# Patient Record
Sex: Male | Born: 1970 | Race: White | Hispanic: No | Smoking: Never smoker
Health system: Southern US, Community
[De-identification: ages and names within clinical notes are randomized; demographics above are authoritative.]

## PROBLEM LIST (undated history)

## (undated) DIAGNOSIS — M549 Dorsalgia, unspecified: Secondary | ICD-10-CM

## (undated) DIAGNOSIS — I10 Essential (primary) hypertension: Secondary | ICD-10-CM

## (undated) DIAGNOSIS — G4733 Obstructive sleep apnea (adult) (pediatric): Secondary | ICD-10-CM

## (undated) DIAGNOSIS — M542 Cervicalgia: Secondary | ICD-10-CM

## (undated) DIAGNOSIS — T7840XA Allergy, unspecified, initial encounter: Secondary | ICD-10-CM

## (undated) DIAGNOSIS — E785 Hyperlipidemia, unspecified: Secondary | ICD-10-CM

## (undated) DIAGNOSIS — H539 Unspecified visual disturbance: Secondary | ICD-10-CM

## (undated) DIAGNOSIS — R51 Headache: Secondary | ICD-10-CM

## (undated) DIAGNOSIS — G4700 Insomnia, unspecified: Secondary | ICD-10-CM

## (undated) DIAGNOSIS — G8929 Other chronic pain: Secondary | ICD-10-CM

## (undated) DIAGNOSIS — R519 Headache, unspecified: Secondary | ICD-10-CM

## (undated) HISTORY — DX: Cervicalgia: M54.2

## (undated) HISTORY — DX: Obstructive sleep apnea (adult) (pediatric): G47.33

## (undated) HISTORY — PX: UPPER GI ENDOSCOPY: SHX6162

## (undated) HISTORY — DX: Headache, unspecified: R51.9

## (undated) HISTORY — DX: Dorsalgia, unspecified: M54.9

## (undated) HISTORY — PX: ELBOW SURGERY: SHX618

## (undated) HISTORY — DX: Headache: R51

## (undated) HISTORY — DX: Unspecified visual disturbance: H53.9

## (undated) HISTORY — DX: Other chronic pain: G89.29

## (undated) HISTORY — DX: Hyperlipidemia, unspecified: E78.5

## (undated) HISTORY — DX: Insomnia, unspecified: G47.00

## (undated) HISTORY — DX: Allergy, unspecified, initial encounter: T78.40XA

## (undated) HISTORY — DX: Essential (primary) hypertension: I10

---

## 1994-10-23 HISTORY — PX: VASECTOMY: SHX75

## 1999-07-21 ENCOUNTER — Other Ambulatory Visit: Admission: RE | Admit: 1999-07-21 | Discharge: 1999-07-21 | Payer: Self-pay | Admitting: Urology

## 1999-10-24 HISTORY — PX: LASIK: SHX215

## 2009-04-02 ENCOUNTER — Encounter: Admission: RE | Admit: 2009-04-02 | Discharge: 2009-04-02 | Payer: Self-pay | Admitting: Orthopaedic Surgery

## 2009-04-02 IMAGING — RF DG FACET JT INJ C&T SPINE SINGLE LEVEL *R*
5 series · 5 of 5 positions shown · non-contrast
Comparison: none

BILATERAL C2-3 CERVICAL FACET INJECTIONS.
CLINICAL DATA: Neck pain

Procedure:  Right and left C2-3 facet injection

[Series 1: myelogram  white · 1 of 1 slices shown (1 of 5)]
[im 1/1]
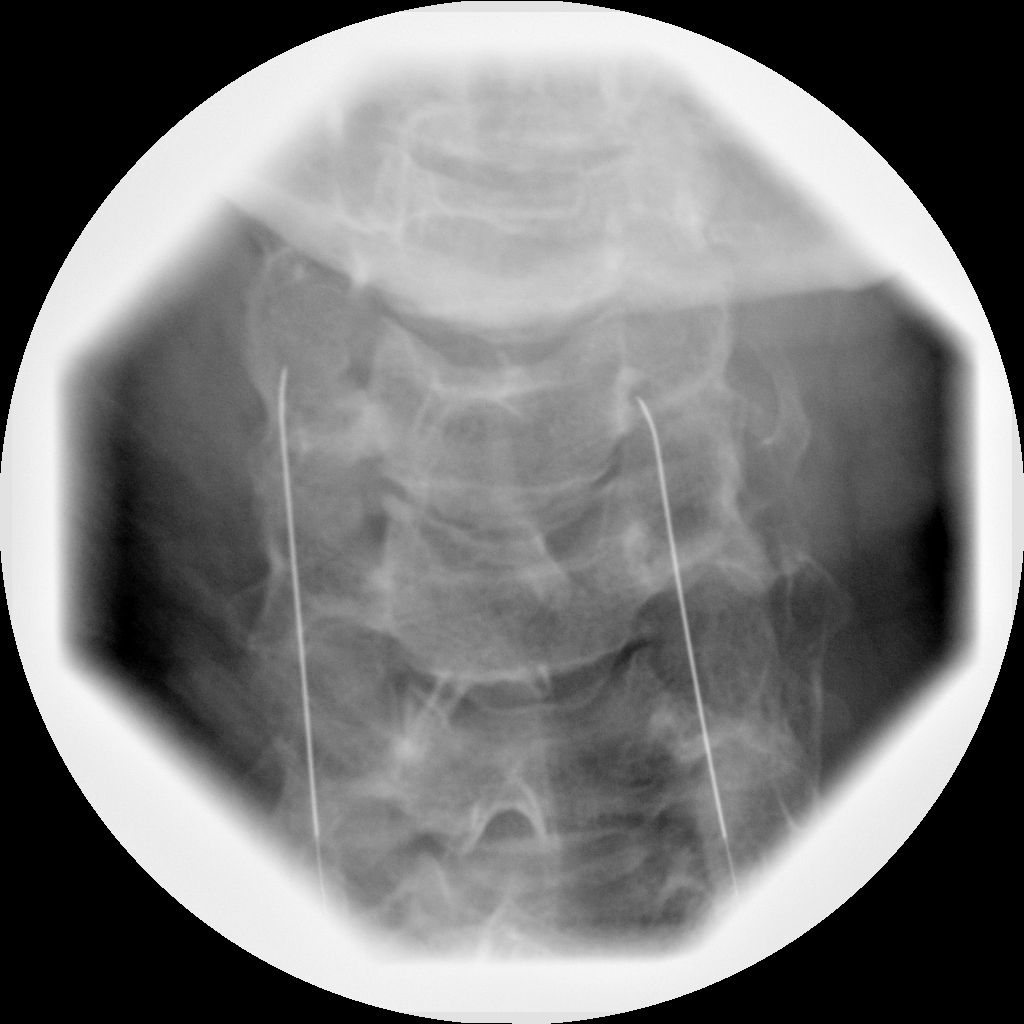

[Series 2: myelogram  white · 1 of 1 slices shown (2 of 5)]
[im 1/1]
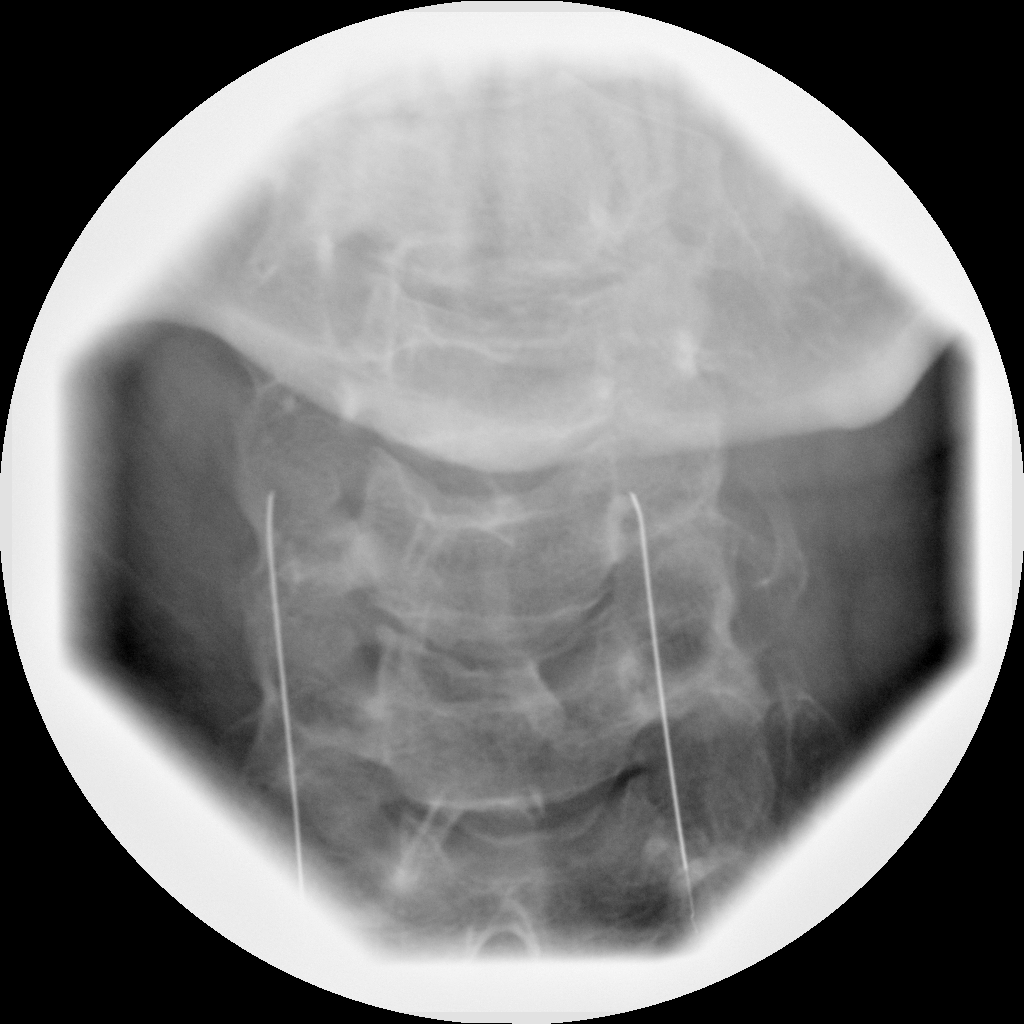

[Series 3: myelogram  white · 1 of 1 slices shown (3 of 5)]
[im 1/1]
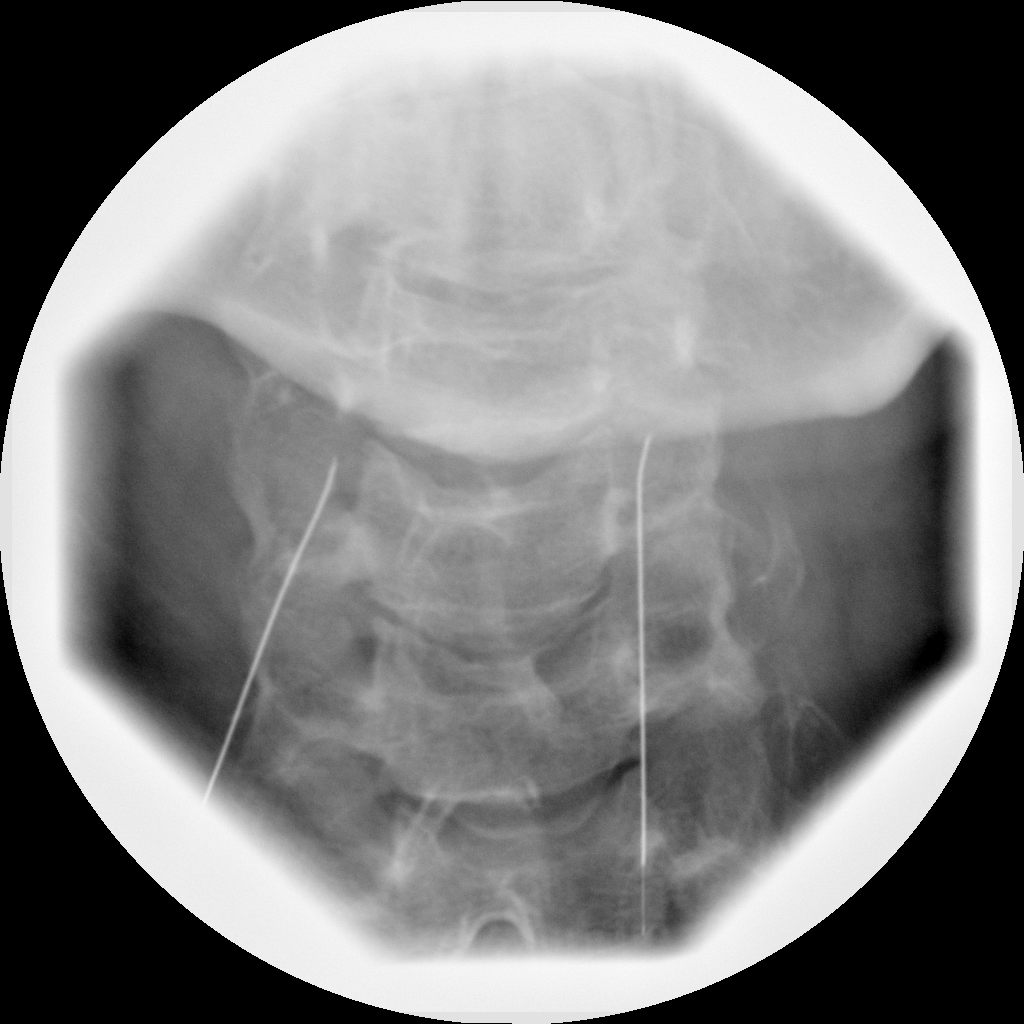

[Series 4: myelogram  white · 1 of 1 slices shown (4 of 5)]
[im 1/1]
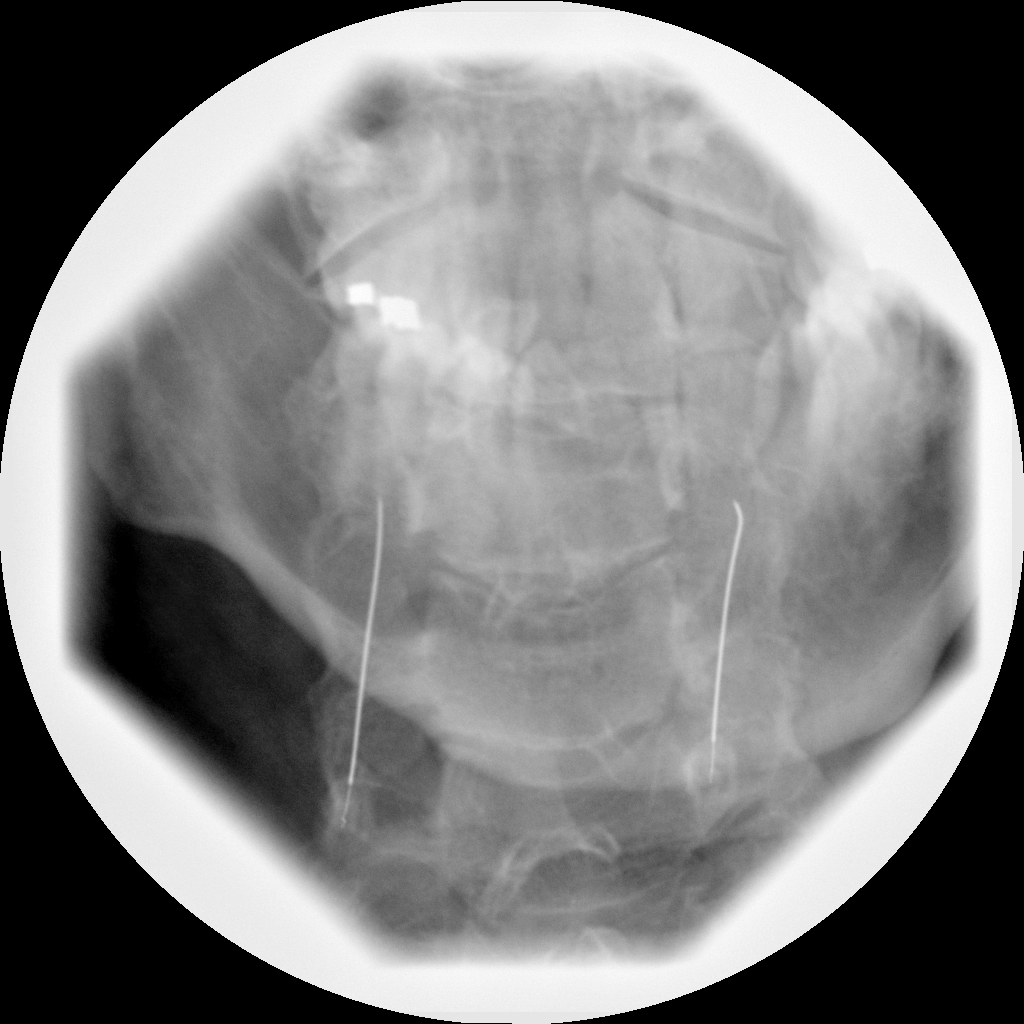

[Series 5: myelogram  white · 1 of 1 slices shown (5 of 5)]
[im 1/1]
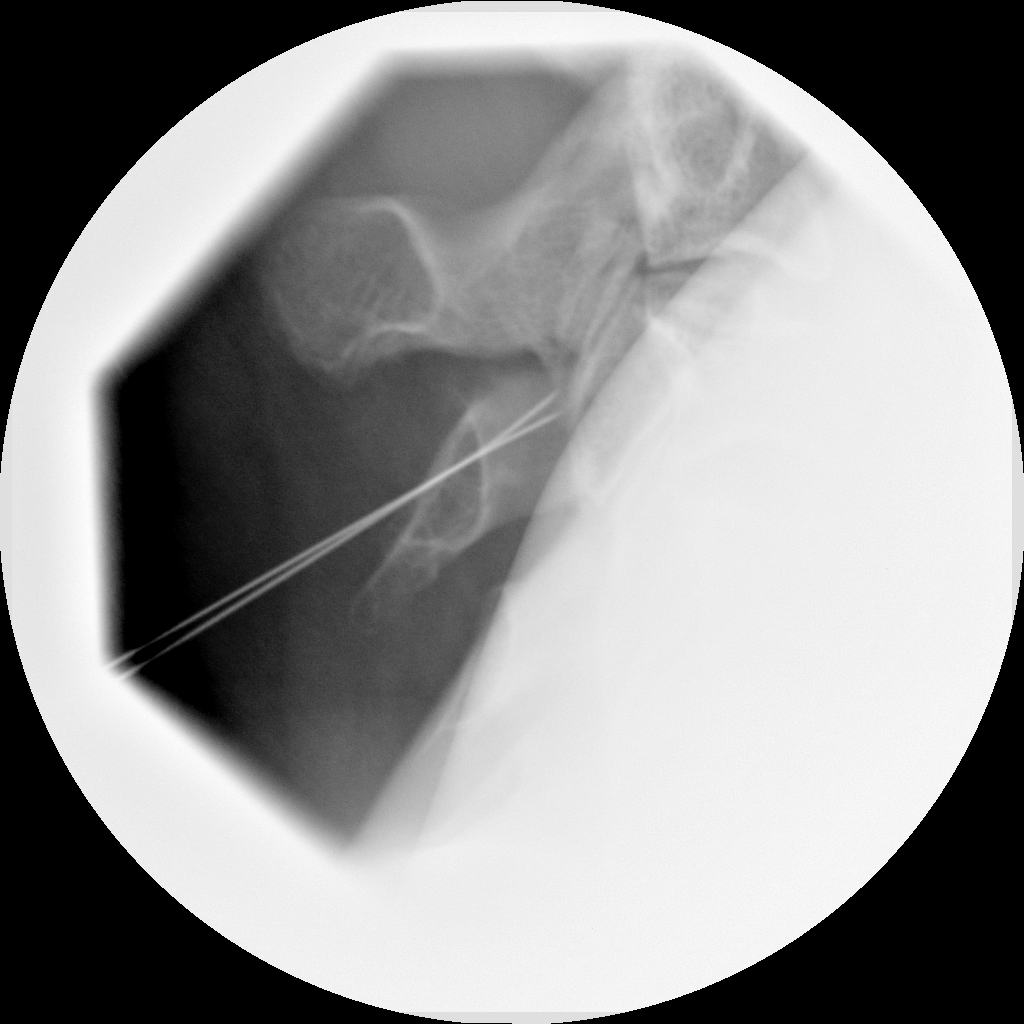

[5 of 5 positions shown; findings below may reference images not displayed]

FINDINGS: Informed, written consent was obtained prior to the first
procedure.  The   right C2-3 facet was localized fluoroscopically,
and the skin was marked.  A thorough Betadine  scrub of the skin
was performed, and a sterile drape was applied.  The skin and
subcutaneous soft tissues were anesthetized with 1%  lidocaine.

Subsequently, a  25 gauge 3.5 inch  spinal needle was advanced to
the facet joint.  Fluoroscopy confirmed the needle tip to be intra-
articular.  Subsequently, 2 mg of Decadron was injected into the
facet joint, and flushed with trace amount of 1% lidocaine.  Post
procedure, the patient was improved and able to ambulate without
pain.

An identical procedure was formed on the left at C2-3.

Fluoroscopy time: 74 seconds
IMPRESSION: Technically successful intra-articular steroid placement, bilateral
C2-3 facet.

The patient is counseled to see if  this procedure alleviates a
significant amount of his headache.  Based on review of the MR, I
do not see any significant amount of inflammation at C1-2.  I felt
therefore that  bilateral C2-3 facet injections should be performed
initially to see if this level was his predominant pain generator.

## 2013-05-26 ENCOUNTER — Encounter: Payer: Self-pay | Admitting: Pulmonary Disease

## 2013-05-26 ENCOUNTER — Ambulatory Visit (INDEPENDENT_AMBULATORY_CARE_PROVIDER_SITE_OTHER): Payer: BC Managed Care – PPO | Admitting: Pulmonary Disease

## 2013-05-26 VITALS — BP 130/76 | HR 63 | Temp 98.9°F | Ht 72.0 in | Wt 206.0 lb

## 2013-05-26 DIAGNOSIS — R0683 Snoring: Secondary | ICD-10-CM

## 2013-05-26 DIAGNOSIS — R0989 Other specified symptoms and signs involving the circulatory and respiratory systems: Secondary | ICD-10-CM

## 2013-05-26 DIAGNOSIS — G894 Chronic pain syndrome: Secondary | ICD-10-CM

## 2013-05-26 DIAGNOSIS — G4700 Insomnia, unspecified: Secondary | ICD-10-CM

## 2013-05-26 DIAGNOSIS — G2581 Restless legs syndrome: Secondary | ICD-10-CM

## 2013-05-26 DIAGNOSIS — R0609 Other forms of dyspnea: Secondary | ICD-10-CM

## 2013-05-26 NOTE — Progress Notes (Deleted)
  Subjective:    Patient ID: Jacob Ryan, male    DOB: December 09, 1970, 42 y.o.   MRN: [id number]  HPI    Review of Systems  Constitutional: Negative for fever, chills, diaphoresis, activity change, appetite change, fatigue and unexpected weight change.  HENT: Negative for hearing loss, ear pain, nosebleeds, congestion, sore throat, facial swelling, rhinorrhea, sneezing, mouth sores, trouble swallowing, neck pain, neck stiffness, dental problem, voice change, postnasal drip, sinus pressure, tinnitus and ear discharge.   Eyes: Negative for photophobia, discharge, itching and visual disturbance.  Respiratory: Negative for apnea, cough, choking, chest tightness, shortness of breath, wheezing and stridor.   Cardiovascular: Negative for chest pain, palpitations and leg swelling.  Gastrointestinal: Negative for nausea, vomiting, abdominal pain, constipation, blood in stool and abdominal distention.  Genitourinary: Negative for dysuria, urgency, frequency, hematuria, flank pain, decreased urine volume and difficulty urinating.  Musculoskeletal: Negative for myalgias, back pain, joint swelling, arthralgias and gait problem.  Skin: Negative for color change, pallor and rash.  Neurological: Positive for headaches. Negative for dizziness, tremors, seizures, syncope, speech difficulty, weakness, light-headedness and numbness.  Hematological: Negative for adenopathy. Does not bruise/bleed easily.  Psychiatric/Behavioral: Negative for confusion, sleep disturbance and agitation. The patient is not nervous/anxious.        Objective:   Physical Exam        Assessment & Plan:

## 2013-05-26 NOTE — Patient Instructions (Signed)
Will get copy of sleep study and call with results Follow up in 6 weeks

## 2013-05-26 NOTE — Progress Notes (Signed)
Chief Complaint  Patient presents with  . Sleep Consult    Self referral. Has a hard time getting to sleep. Epworth: 2.    History of Present Illness: Jacob Ryan is a 42 y.o. male for evaluation of sleep problems.  He made this appointment on his own.    He has noticed trouble with his sleep for at least 20 years.  He started [place] years ago.  This helped initially, but is not working as well now.  He has a crazy schedule, and will lay in bed thinking about things.  He tried taking Z-quil this past week >> this helped for a few nights, but then he was back to having trouble again.  He has been told he snores.  He is not aware of whether he stops breathing while asleep.  He has trouble falling asleep mostly.  He says he can stay awake for 36 to 48 hours at a stretch before sleeping.  When he sleeps he can sometimes sleep for a few hours, and sometimes up to 6 hours at a stretch >> this does not happen often.  He goes to sleep at 1030 pm.  He falls asleep after several hours if at all.  He wakes up at 330 am when his girlfriend's alarm goes off, and will not be able to fall back to sleep.  He gets out of bed at 6 am.  He feels tired in the morning.  He gets headaches all day, and this is related to neck problems.  He has been followed by Dr. Chriss Czar with neurology.  He does not use anything to help him fall sleep or stay awake.  He reports having a sleep study at [facility] about 6 months ago >> he ws told he did not have any sleep apnea, but he did have mild restless legs.  He was tried on therapy for restless legs, but this didn't help at all.  He denies sleep walking, sleep talking, bruxism, or nightmares.  He denies sleep hallucinations, sleep paralysis, or cataplexy.  The Epworth score is 2 out of 24.  Tests:   Jacob Ryan  has a past medical history of Chronic headaches.  Jacob Ryan  has no past surgical history on file.  Prior to  Admission medications   Medication Sig Start Date End Date Taking? Authorizing Provider  carbamazepine (TEGRETOL) 200 MG tablet Take 1 tablet by mouth 2 (two) times daily. 05/09/13  Yes Historical Provider, MD  gabapentin (NEURONTIN) 300 MG capsule Take 1 capsule by mouth daily. 05/09/13  Yes Historical Provider, MD  meloxicam (MOBIC) 15 MG tablet Take 1 tablet by mouth daily. 05/09/13  Yes Historical Provider, MD  zolpidem (AMBIEN) 10 MG tablet Take 1 tablet by mouth at bedtime as needed. 05/09/13  Yes Historical Provider, MD    Allergies  Allergen Reactions  . Amoxicillin     His family history is not on file.  He  reports that he has never smoked. He has never used smokeless tobacco. He reports that he does not drink alcohol or use illicit drugs.  Review of Systems  Constitutional: Negative for fever, chills, diaphoresis, activity change, appetite change, fatigue and unexpected weight change.  HENT: Negative for hearing loss, ear pain, nosebleeds, congestion, sore throat, facial swelling, rhinorrhea, sneezing, mouth sores, trouble swallowing, neck pain, neck stiffness, dental problem, voice change, postnasal drip, sinus pressure, tinnitus and ear discharge.   Eyes: Negative for photophobia, discharge, itching  and visual disturbance.  Respiratory: Negative for apnea, cough, choking, chest tightness, shortness of breath, wheezing and stridor.   Cardiovascular: Negative for chest pain, palpitations and leg swelling.  Gastrointestinal: Negative for nausea, vomiting, abdominal pain, constipation, blood in stool and abdominal distention.  Genitourinary: Negative for dysuria, urgency, frequency, hematuria, flank pain, decreased urine volume and difficulty urinating.  Musculoskeletal: Negative for myalgias, back pain, joint swelling, arthralgias and gait problem.  Skin: Negative for color change, pallor and rash.  Neurological: Positive for headaches. Negative for dizziness, tremors, seizures,  syncope, speech difficulty, weakness, light-headedness and numbness.  Hematological: Negative for adenopathy. Does not bruise/bleed easily.  Psychiatric/Behavioral: Negative for confusion, sleep disturbance and agitation. The patient is not nervous/anxious.    Physical Exam:  General - No distress ENT - No sinus tenderness, no oral exudate, MP 3, narrow jaw, enlarged tongue, no LAN, no thyromegaly, TM clear, pupils equal/reactive Cardiac - s1s2 regular, no murmur, pulses symmetric Chest - No wheeze/rales/dullness, good air entry, normal respiratory excursion Back - No focal tenderness Abd - Soft, non-tender, no organomegaly, + bowel sounds Ext - No edema Neuro - Normal strength, cranial nerves intact Skin - No rashes Psych - Normal mood, and behavior  Assessment:  Coralyn Helling, MD [facility] Pulmonary/Critical Care 05/29/2013, 11:01 AM Pager:  [phone] After 3pm call: [phone]

## 2013-05-29 DIAGNOSIS — G2581 Restless legs syndrome: Secondary | ICD-10-CM | POA: Insufficient documentation

## 2013-05-29 DIAGNOSIS — G478 Other sleep disorders: Secondary | ICD-10-CM | POA: Insufficient documentation

## 2013-05-29 DIAGNOSIS — G894 Chronic pain syndrome: Secondary | ICD-10-CM | POA: Insufficient documentation

## 2013-05-29 DIAGNOSIS — G4700 Insomnia, unspecified: Secondary | ICD-10-CM | POA: Insufficient documentation

## 2013-05-29 NOTE — Assessment & Plan Note (Signed)
I have explained to him that his sleep issues have been present for years and are not likely to improve quickly.  I would like to get a copy of his recent sleep study first before proceeding with additional interventions with his insomnia.  He can continue [place] for now.  If his recent sleep study is unrevealing with adequate sleep time in lab, would then need to discuss cognitive behavioral techniques more.  For this he may need assist from psychiatry/psychology.

## 2013-05-29 NOTE — Assessment & Plan Note (Signed)
He was told he has restless legs after recent sleep study.  He does not have significant leg symptoms at present.  Perhaps he was found to have increased periodic limb movements syndrome w/o the disorder.  Will assess further after review of his recent sleep study.

## 2013-05-29 NOTE — Assessment & Plan Note (Signed)
He has chronic neck and back pain.  These issues are likely contributing to his sleep problems.  He is followed by neurology.     

## 2013-05-29 NOTE — Assessment & Plan Note (Signed)
He reports snoring, and upper airway anatomy is suggestive of risk for sleep apnea.  He reports recent sleep study negative for sleep apnea.  Will request copy of sleep study to ensure he had adequate sleep time during study to effectively exclude possibility of sleep apnea.  If he did not have adequate sleep time, then he may warrant repeat in lab sleep study.

## 2013-07-02 ENCOUNTER — Other Ambulatory Visit (INDEPENDENT_AMBULATORY_CARE_PROVIDER_SITE_OTHER): Payer: BC Managed Care – PPO

## 2013-07-02 ENCOUNTER — Encounter: Payer: Self-pay | Admitting: Pulmonary Disease

## 2013-07-02 ENCOUNTER — Ambulatory Visit (INDEPENDENT_AMBULATORY_CARE_PROVIDER_SITE_OTHER): Payer: BC Managed Care – PPO | Admitting: Pulmonary Disease

## 2013-07-02 VITALS — BP 122/86 | HR 58 | Ht 72.0 in | Wt 199.0 lb

## 2013-07-02 DIAGNOSIS — G478 Other sleep disorders: Secondary | ICD-10-CM

## 2013-07-02 DIAGNOSIS — G4700 Insomnia, unspecified: Secondary | ICD-10-CM

## 2013-07-02 DIAGNOSIS — G2581 Restless legs syndrome: Secondary | ICD-10-CM

## 2013-07-02 DIAGNOSIS — D649 Anemia, unspecified: Secondary | ICD-10-CM

## 2013-07-02 DIAGNOSIS — G894 Chronic pain syndrome: Secondary | ICD-10-CM

## 2013-07-02 LAB — CBC
HCT: 46.6 % (ref 39.0–52.0)
Hemoglobin: 16.4 g/dL (ref 13.0–17.0)
MCHC: 35.3 g/dL (ref 30.0–36.0)
MCV: 91.9 fl (ref 78.0–100.0)
Platelets: 183 10*3/uL (ref 150.0–400.0)
RBC: 5.07 Mil/uL (ref 4.22–5.81)
RDW: 12.6 % (ref 11.5–14.6)
WBC: 6.6 10*3/uL (ref 4.5–10.5)

## 2013-07-02 LAB — COMPREHENSIVE METABOLIC PANEL
ALT: 28 U/L (ref 0–53)
AST: 26 U/L (ref 0–37)
Albumin: 4.3 g/dL (ref 3.5–5.2)
Alkaline Phosphatase: 102 U/L (ref 39–117)
BUN: 16 mg/dL (ref 6–23)
CO2: 31 mEq/L (ref 19–32)
Calcium: 9.5 mg/dL (ref 8.4–10.5)
Chloride: 99 mEq/L (ref 96–112)
Creatinine, Ser: 1.2 mg/dL (ref 0.4–1.5)
GFR: 70.03 mL/min (ref >60.00)
Glucose, Bld: 100 mg/dL — ABNORMAL HIGH (ref 70–99)
Potassium: 3.9 mEq/L (ref 3.5–5.1)
Sodium: 139 mEq/L (ref 135–145)
Total Bilirubin: 0.6 mg/dL (ref 0.3–1.2)
Total Protein: 7.7 g/dL (ref 6.0–8.3)

## 2013-07-02 LAB — FERRITIN: Ferritin: 480.4 ng/mL — ABNORMAL HIGH (ref 22.0–322.0)

## 2013-07-02 LAB — IRON AND TIBC
%SAT: 45 % (ref 20–55)
Iron: 132 ug/dL (ref 42–165)
TIBC: 292 ug/dL (ref 215–435)
UIBC: 160 ug/dL (ref 125–400)

## 2013-07-02 LAB — VITAMIN B12: Vitamin B-12: 658 pg/mL (ref 211–911)

## 2013-07-02 LAB — FOLATE: Folate: 15.5 ng/mL (ref >5.9)

## 2013-07-02 MED ORDER — ZOLPIDEM TARTRATE 10 MG PO TABS: 10.0000 mg | ORAL_TABLET | Freq: Every evening | ORAL | Status: DC | PRN

## 2013-07-02 NOTE — Assessment & Plan Note (Signed)
Will check his labs.  ?

## 2013-07-02 NOTE — Assessment & Plan Note (Signed)
Stable on current regimen of ambien 10 mg nightly.  Explained how some patients require sleep aides, and this is not necessarily an addiction.

## 2013-07-02 NOTE — Patient Instructions (Signed)
Lab tests today >> will call with results Follow up in 6 months 

## 2013-07-02 NOTE — Assessment & Plan Note (Signed)
He has chronic neck and back pain.  These issues are likely contributing to his sleep problems.  He is followed by neurology.     

## 2013-07-02 NOTE — Assessment & Plan Note (Signed)
He does not meet criteria for sleep apnea.  Will monitor clinically.  Discussed positional sleep techniques.  Also explained how this can progress to sleep apnea with increase in weight, and advancing age.

## 2013-07-02 NOTE — Progress Notes (Signed)
Chief Complaint  Patient presents with  . Follow-up    Neurologist will no longer give him Ambien, wants Dr. Craige Cotta to take over.    History of Present Illness: Jacob Ryan is a 42 y.o. male with sleep disruption from chronic pain, insomnia, restless legs.  He is using 10 mg ambien at night.  He takes this at 815 pm.  He goes to bed at 10 pm, and falls asleep quickly.  He is sleeping through the night.  His alarm goes off at 8 am.  He gets out of bed at 830 am.  He feels okay in the morning, but occasionally feels groggy.  He had more trouble with his sleep when he was using 5 mg ambien.  He does get funny leg feelings, and pains in his knees.  TESTS: PSG 05/15/12 >> AHI 3, SpO2 low 86%, PLMI 27.2, UARS  Zakariah D Ta  has a past medical history of Chronic headaches.  [name]  has no past surgical history on file.  Prior to Admission medications   Medication Sig Start Date End Date Taking? Authorizing Provider  carbamazepine (TEGRETOL) 200 MG tablet Take 1 tablet by mouth 2 (two) times daily. 05/09/13  Yes Historical Provider, MD  gabapentin (NEURONTIN) 300 MG capsule Take 1 capsule by mouth daily. 05/09/13  Yes Historical Provider, MD  zolpidem (AMBIEN) 10 MG tablet Take 1 tablet by mouth at bedtime as needed. 05/09/13  Yes Historical Provider, MD    Allergies  Allergen Reactions  . Amoxicillin      Physical Exam:  General - No distress ENT - No sinus tenderness, MP 3, narrow jaw, enlarged tongue, no oral exudate, no LAN Cardiac - s1s2 regular, no murmur Chest - No wheeze/rales/dullness Back - No focal tenderness Abd - Soft, non-tender Ext - No edema Neuro - Normal strength Skin - No rashes Psych - normal mood, and behavior   Assessment/Plan:  Coralyn Helling, MD [place] Pulmonary/Critical Care/Sleep Pager:  [phone]

## 2013-07-03 ENCOUNTER — Telehealth: Payer: Self-pay | Admitting: Pulmonary Disease

## 2013-07-03 NOTE — Telephone Encounter (Signed)
Pt is aware of results. 

## 2013-07-03 NOTE — Telephone Encounter (Signed)
CMP     Component Value Date/Time   NA 139 07/02/2013 0941   K 3.9 07/02/2013 0941   CL 99 07/02/2013 0941   CO2 31 07/02/2013 0941   GLUCOSE 100* 07/02/2013 0941   BUN 16 07/02/2013 0941   CREATININE 1.2 07/02/2013 0941   CALCIUM 9.5 07/02/2013 0941   PROT 7.7 07/02/2013 0941   ALBUMIN 4.3 07/02/2013 0941   AST 26 07/02/2013 0941   ALT 28 07/02/2013 0941   ALKPHOS 102 07/02/2013 0941   BILITOT 0.6 07/02/2013 0941    Lab Results  Component Value Date   WBC 6.6 07/02/2013   HGB 16.4 07/02/2013   HCT 46.6 07/02/2013   MCV 91.9 07/02/2013   PLT 183.0 07/02/2013   Iron/TIBC/Ferritin    Component Value Date/Time   IRON 132 07/02/2013 0941   TIBC 292 07/02/2013 0941   FERRITIN 480.4* 07/02/2013 0941   Lab Results  Component Value Date   FOLATE 15.5 07/02/2013   Lab Results  Component Value Date   VITAMINB12 658 07/02/2013   Will have my nurse inform pt that lab tests are normal.  No change to current treatment plan.

## 2013-12-31 ENCOUNTER — Telehealth: Payer: Self-pay | Admitting: Pulmonary Disease

## 2013-12-31 MED ORDER — ZOLPIDEM TARTRATE 10 MG PO TABS: 10.0000 mg | ORAL_TABLET | Freq: Every evening | ORAL | Status: DC | PRN

## 2013-12-31 NOTE — Telephone Encounter (Signed)
Pt requesting refill on ambien 10 mg. Last refilled 07/02/13 #30 x 5 refills Last OV 07/02/13 Pending OV 01/14/14 Please advise Dr. Halford Chessman thanks

## 2013-12-31 NOTE — Telephone Encounter (Signed)
Okay to send refill. 

## 2013-12-31 NOTE — Telephone Encounter (Signed)
I have called RX into [facility].  Pt is aware and needed nothing further

## 2014-01-14 ENCOUNTER — Encounter: Payer: Self-pay | Admitting: Pulmonary Disease

## 2014-01-14 ENCOUNTER — Ambulatory Visit (INDEPENDENT_AMBULATORY_CARE_PROVIDER_SITE_OTHER): Payer: BC Managed Care – PPO | Admitting: Pulmonary Disease

## 2014-01-14 VITALS — BP 130/84 | HR 78 | Ht 72.0 in | Wt 213.0 lb

## 2014-01-14 DIAGNOSIS — G4700 Insomnia, unspecified: Secondary | ICD-10-CM

## 2014-01-14 DIAGNOSIS — G2581 Restless legs syndrome: Secondary | ICD-10-CM

## 2014-01-14 DIAGNOSIS — G478 Other sleep disorders: Secondary | ICD-10-CM

## 2014-01-14 DIAGNOSIS — G894 Chronic pain syndrome: Secondary | ICD-10-CM

## 2014-01-14 MED ORDER — ZOLPIDEM TARTRATE 10 MG PO TABS: 10.0000 mg | ORAL_TABLET | Freq: Every day | ORAL | Status: DC

## 2014-01-14 NOTE — Patient Instructions (Signed)
Follow up in 6 months 

## 2014-01-14 NOTE — Assessment & Plan Note (Signed)
Not much of an issue at present.

## 2014-01-14 NOTE — Assessment & Plan Note (Signed)
He has chronic neck and back pain.  These issues are likely contributing to his sleep problems.  He is followed by neurology.

## 2014-01-14 NOTE — Assessment & Plan Note (Signed)
Will refill his ambien with 45 pills per prescription so that he can have extra to use as needed if he has trouble falling back to sleep.

## 2014-01-14 NOTE — Assessment & Plan Note (Signed)
Discussed how weight gain can impact this, and that he may develop sleep apnea if he does not control his weight better.

## 2014-01-14 NOTE — Progress Notes (Signed)
Chief Complaint  Patient presents with  . Follow-up    Sleeping very up and down--Some weeks sleeping well and others not much at all.  Also, having alot of strange dreams when sleeping    History of Present Illness: Jacob Ryan is a 43 y.o. male with sleep disruption from chronic pain, insomnia, restless legs.  He has gained about 15 lbs since last visit >> he reports this always happens in winter, but then he can lose weight in summer.  He has notice having more vivid dreams, and snoring more on his back.  He has to take his [place] with food.  He eats dinner at 830 pm, and then goes to bed by 930 pm or 10 pm.  He usually sleeps through until his alarm goes off at 8 am.  He does not feel like he is a morning person and can't wake up earlier than this.  He sometimes wakes up in the middle of the night and needs to take an additional 1/2 pill of ambien to fall back to sleep.  As a result he sometimes run short of his Lorrin Mais before his next refill is due.  TESTS: PSG 05/15/12 >> AHI 3, SpO2 low 86%, PLMI 27.2, UARS  Jacob Ryan  has a past medical history of Chronic headaches.  Jacob Ryan  has no past surgical history on file.  Prior to Admission medications   Medication Sig Start Date End Date Taking? Authorizing Provider  carbamazepine (TEGRETOL) 200 MG tablet Take 1 tablet by mouth 2 (two) times daily. 05/09/13  Yes Historical Provider, MD  gabapentin (NEURONTIN) 300 MG capsule Take 1 capsule by mouth daily. 05/09/13  Yes Historical Provider, MD  zolpidem (AMBIEN) 10 MG tablet Take 1 tablet by mouth at bedtime as needed. 05/09/13  Yes Historical Provider, MD    Allergies  Allergen Reactions  . Amoxicillin      Physical Exam:  General - No distress ENT - No sinus tenderness, MP 3, narrow jaw, enlarged tongue, no oral exudate, no LAN Cardiac - s1s2 regular, no murmur Chest - No wheeze/rales/dullness Back - No focal tenderness Abd - Soft, non-tender Ext - No  edema Neuro - Normal strength Skin - No rashes Psych - normal mood, and behavior   Assessment/Plan:  Jacob Mires, MD [place] Pulmonary/Critical Care/Sleep Pager:  [phone]

## 2014-03-19 ENCOUNTER — Telehealth: Payer: Self-pay | Admitting: Pulmonary Disease

## 2014-03-19 NOTE — Telephone Encounter (Signed)
Called and spoke with the pt and he stated that the [place] rx needs to be fixed.  He stated that the first rx he picked up he was given #45 tablets.  He stated then they would not fill the rx for 45 days and then the second refill he only got 30 tablets.  rx will need to say 1-2 tablets as needed for sleep at bedtime.  VS please advise if ok to send the rx in for the pt like this.  Thanks  Allergies  Allergen Reactions  . Amoxicillin     Current Outpatient Prescriptions on File Prior to Visit  Medication Sig Dispense Refill  . carbamazepine (TEGRETOL) 200 MG tablet Take 1 tablet by mouth 2 (two) times daily.      Marland Kitchen gabapentin (NEURONTIN) 300 MG capsule Take 1 capsule by mouth daily.      Marland Kitchen zolpidem (AMBIEN) 10 MG tablet Take 1 tablet (10 mg total) by mouth at bedtime.  45 tablet  5   No current facility-administered medications on file prior to visit.

## 2014-03-20 MED ORDER — ZOLPIDEM TARTRATE 10 MG PO TABS: ORAL_TABLET | ORAL | Status: DC

## 2014-03-20 NOTE — Telephone Encounter (Signed)
Spoke with patient- he is aware that VS gave approval to change sig to read take 1-2 tablets qhs prn; I have called new Rx to [place], [place] pharmacy voicemail. Pt will go by to pick up Rx later today. Nothing more needed at this time.

## 2014-03-20 NOTE — Telephone Encounter (Signed)
Okay to change script to ambien 10 mg pills, 1 to 2 pills qhs prn, dispense 45 pills with 3 refills.

## 2014-04-17 ENCOUNTER — Telehealth: Payer: Self-pay | Admitting: Pulmonary Disease

## 2014-04-17 NOTE — Telephone Encounter (Signed)
Spoke with the pt  He states that for the past 2 months the ambien 10 mg has not been working well anymore  He states that he takes 1 tablet and falls asleep easily, but then wakes up approx 4 hours later and has to take another 1/2 tablet to get more rest  He states that sometimes he is not able to go back to sleep at all  He is asking if we can change med to something else, maybe something time released  Please advise thanks!

## 2014-04-21 MED ORDER — ZOLPIDEM TARTRATE ER 12.5 MG PO TBCR: 12.5000 mg | EXTENDED_RELEASE_TABLET | Freq: Every evening | ORAL | Status: DC | PRN

## 2014-04-21 NOTE — Telephone Encounter (Signed)
Called spoke with pt. Aware of recs. [facility]. Nothing further needed

## 2014-04-21 NOTE — Telephone Encounter (Signed)
Can try changing him to ambien CR 12.5 mg one pill nightly prn for sleep.  Dispense 30 tablets with 2 refills.

## 2014-06-11 ENCOUNTER — Telehealth: Payer: Self-pay | Admitting: Pulmonary Disease

## 2014-06-11 MED ORDER — ZOLPIDEM TARTRATE ER 12.5 MG PO TBCR: 12.5000 mg | EXTENDED_RELEASE_TABLET | Freq: Every evening | ORAL | Status: DC | PRN

## 2014-06-11 NOTE — Telephone Encounter (Signed)
I spoke with the pt and he states that over the last month he has had some bad nights and has taken an additional ambien 12.5 CR to help him sleep. He states that he is now going to be 4 tablets short for the month and the pharmacy will not give him a refill. I advised the pt that he should not take extra medication without his doctors approval. He states understanding. I scheduled the pt an appt on 06-15-14 to discuss the issues he is having. He does not have enough medication to last until the appt. I advised the pt that I will send a message to Dr. Halford Chessman to see if he will give the ok to send in a few tablets to last until his OV. Please advise. R[place]ale Bing, CMA Allergies  Allergen Reactions  . Amoxicillin

## 2014-06-11 NOTE — Telephone Encounter (Signed)
Called and spoke to pt. Informed pt of refill. Pt verified pharmacy. Pt verbalized understanding and denied any further questions or concerns at this time.

## 2014-06-11 NOTE — Telephone Encounter (Signed)
Okay to send script for ambien CR 12.5 mg, one qhs prn sleep, dispense 30 with 3 refills.

## 2014-06-15 ENCOUNTER — Encounter: Payer: Self-pay | Admitting: Pulmonary Disease

## 2014-06-15 ENCOUNTER — Ambulatory Visit (INDEPENDENT_AMBULATORY_CARE_PROVIDER_SITE_OTHER): Payer: BC Managed Care – PPO | Admitting: Pulmonary Disease

## 2014-06-15 VITALS — BP 142/88 | HR 72 | Ht 72.0 in | Wt 210.0 lb

## 2014-06-15 DIAGNOSIS — F419 Anxiety disorder, unspecified: Secondary | ICD-10-CM

## 2014-06-15 DIAGNOSIS — F329 Major depressive disorder, single episode, unspecified: Secondary | ICD-10-CM

## 2014-06-15 DIAGNOSIS — G4700 Insomnia, unspecified: Secondary | ICD-10-CM

## 2014-06-15 DIAGNOSIS — F341 Dysthymic disorder: Secondary | ICD-10-CM

## 2014-06-15 DIAGNOSIS — G894 Chronic pain syndrome: Secondary | ICD-10-CM

## 2014-06-15 MED ORDER — ALPRAZOLAM 0.5 MG PO TABS: 0.5000 mg | ORAL_TABLET | Freq: Every evening | ORAL | Status: DC | PRN

## 2014-06-15 NOTE — Progress Notes (Signed)
Chief Complaint  Patient presents with  . Follow-up    Discuss issues with Ambien. Would like to know VS thoughts on using Klonipin for sleep. Pt also concerned with recent findings of mold in home.     History of Present Illness: Jacob Ryan is a 43 y.o. male with sleep disruption from chronic pain, insomnia, restless legs.  He continues to trouble with his sleep.  He is having a lot of stress at work.  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  He feels like he works just to pay his employees.    He is able to fall asleep more easily since using ambien CR.  He will sleep from about 11 pm until 6 am.  He wants to stay in bed until 8 am.  He will sometimes wake up in the middle of the night, and can't go back to sleep.  He has tried taking extra ambien CR, but then runs short for the month.  He was given script for klonopin from urgent care >> this helped when he can't go back to sleep, but he was worried about hang over effect the next day from this.  He tried one of his girlfriend's xanax, and this seemed to help better.  He does admit to feeling depressed and anxious sometimes, but does not think he needs anything for this at present.  He feels this is mostly related to difficulties at work.  TESTS: PSG 05/15/12 >> AHI 3, SpO2 low 86%, PLMI 27.2, UARS  Physical Exam:  General - No distress ENT - No sinus tenderness, MP 3, narrow jaw, enlarged tongue, no oral exudate, no LAN Cardiac - s1s2 regular, no murmur Chest - No wheeze/rales/dullness Back - No focal tenderness Abd - Soft, non-tender Ext - No edema Neuro - Normal strength Skin - No rashes Psych - normal mood, and behavior   Assessment/Plan:  [name], MD [place] Pulmonary/Critical Care/Sleep Pager:  [phone]

## 2014-06-15 NOTE — Patient Instructions (Signed)
Alprazolam (xanax) 0.5 mg >> 1 to 2 pills nightly as needed to help sleep Continue Ambien CR nightly before bedtime Follow up in 6 months

## 2014-06-17 DIAGNOSIS — F419 Anxiety disorder, unspecified: Secondary | ICD-10-CM

## 2014-06-17 DIAGNOSIS — F329 Major depressive disorder, single episode, unspecified: Secondary | ICD-10-CM | POA: Insufficient documentation

## 2014-06-17 NOTE — Assessment & Plan Note (Signed)
I suspect these are contributing to his current sleep difficulties.  Will have him try xanax prn.  Advised him to stop using klonopin while on xanax.  Also discussed option of referral to behavioral health >> he would like to monitor his response to current interventions.  Will re-assess at next visit if he would benefit from referral.

## 2014-06-17 NOTE — Assessment & Plan Note (Signed)
He has chronic neck and back pain.  He is followed by neurology.

## 2014-06-17 NOTE — Assessment & Plan Note (Signed)
He is to continue Ambien CR.  Discussed importance of maintaining a regular sleep/wake schedule.  Also discussed proper expectation from sleep, and that goal of therapy is to allow him to maintain needed daytime function while avoiding over-medication.

## 2014-10-06 ENCOUNTER — Other Ambulatory Visit: Payer: Self-pay | Admitting: Pulmonary Disease

## 2014-10-09 NOTE — Telephone Encounter (Signed)
Pt last had ambien refilled 06/11/14 #30 x 3 refills Please advise Dr. Halford Chessman thanks

## 2014-10-12 ENCOUNTER — Telehealth: Payer: Self-pay | Admitting: Pulmonary Disease

## 2014-10-12 MED ORDER — ZOLPIDEM TARTRATE ER 12.5 MG PO TBCR: 12.5000 mg | EXTENDED_RELEASE_TABLET | Freq: Every evening | ORAL | Status: DC | PRN

## 2014-10-12 NOTE — Telephone Encounter (Signed)
Ambien CR refilled x 1 to last until 11/2014 appt with VS.  Per last OV notes, pt was advised to stay on medication for treatment of insomnia. Nothing further needed.

## 2014-12-08 ENCOUNTER — Other Ambulatory Visit: Payer: Self-pay | Admitting: Pulmonary Disease

## 2014-12-10 ENCOUNTER — Telehealth: Payer: Self-pay | Admitting: Pulmonary Disease

## 2014-12-10 MED ORDER — ZOLPIDEM TARTRATE ER 12.5 MG PO TBCR: 12.5000 mg | EXTENDED_RELEASE_TABLET | Freq: Every evening | ORAL | Status: DC | PRN

## 2014-12-10 NOTE — Telephone Encounter (Signed)
Called spoke with pt. He is requesting refill on ambien 12.5mg . Last refilled 10/12/14 #30 x 1 refill Pt has pending appt 12/21/14. Please advise thanks

## 2014-12-10 NOTE — Telephone Encounter (Signed)
Okay to send refill. 

## 2014-12-10 NOTE — Telephone Encounter (Signed)
Called and spoke with pt and he is aware of refill that has been called to the pharmacy.  Pt voiced his understanding and nothing further is needed.

## 2014-12-21 ENCOUNTER — Encounter: Payer: Self-pay | Admitting: Pulmonary Disease

## 2014-12-21 ENCOUNTER — Ambulatory Visit (INDEPENDENT_AMBULATORY_CARE_PROVIDER_SITE_OTHER): Payer: BLUE CROSS/BLUE SHIELD | Admitting: Pulmonary Disease

## 2014-12-21 VITALS — BP 144/96 | HR 55 | Temp 98.9°F | Ht 72.0 in | Wt 206.4 lb

## 2014-12-21 DIAGNOSIS — F329 Major depressive disorder, single episode, unspecified: Secondary | ICD-10-CM

## 2014-12-21 DIAGNOSIS — G4700 Insomnia, unspecified: Secondary | ICD-10-CM

## 2014-12-21 DIAGNOSIS — F419 Anxiety disorder, unspecified: Secondary | ICD-10-CM

## 2014-12-21 DIAGNOSIS — F32A Depression, unspecified: Secondary | ICD-10-CM

## 2014-12-21 DIAGNOSIS — F418 Other specified anxiety disorders: Secondary | ICD-10-CM

## 2014-12-21 MED ORDER — ZOLPIDEM TARTRATE 10 MG PO TABS: 10.0000 mg | ORAL_TABLET | Freq: Every evening | ORAL | Status: DC | PRN

## 2014-12-21 MED ORDER — ZOLPIDEM TARTRATE ER 12.5 MG PO TBCR: 12.5000 mg | EXTENDED_RELEASE_TABLET | Freq: Every evening | ORAL | Status: DC | PRN

## 2014-12-21 MED ORDER — ZOLPIDEM TARTRATE 5 MG PO TABS: 5.0000 mg | ORAL_TABLET | Freq: Every evening | ORAL | Status: DC | PRN

## 2014-12-21 NOTE — Progress Notes (Signed)
Chief Complaint  Patient presents with  . Follow-up    Pt states that the Xanax given last OV is not working. Pt tried 1-2 tablets. Pt still having difficulty sleeping. Taking Ambien at bedtime. Pt states that some weeks he cannot sleep at all and others he can sleep all night or part of the night.     History of Present Illness: Jacob Ryan is a 44 y.o. male with sleep disruption from chronic pain, insomnia, restless legs.  He did not feel like klonopin or xanax helped.  He feels that [place] CR will help him fall asleep.  He will get episodes that can last for days in which he will wake up, and then not be able to fall back to sleep.  He has taken regular ambien in these situations, and is then able to fall back to sleep.    He still has a great deal of stress/anxiety related to his work.  He is in the process of changing his work, and this has made his stress level worse.  TESTS: PSG 05/15/12 >> AHI 3, SpO2 low 86%, PLMI 27.2, UARS  Physical Exam: Blood pressure 144/96, pulse 55, temperature 98.9 F (37.2 C), temperature source Oral, height 6' (1.829 m), weight 206 lb 6.4 oz (93.622 kg), SpO2 96 %. Body mass index is 27.99 kg/(m^2).   Assessment/Plan:  Insomnia. Plan: - discussed sleep restriction, stimulus control, and relaxation techniques - advised him to keep sleep diary - will d/c xanax - he is to continue ambien CR qhs - will give him script for zolpidem 10 mg that he can use prn when he has trouble falling back to sleep  Anxiety. Plan: - discussed option of referral behavioral health >> he would like to defer this for now  Chronic neck/back pain. Plan: - f/u with neurology  Time spent: 25 minutes.  [name], MD [place] [facility] Pager:  [phone]

## 2014-12-21 NOTE — Patient Instructions (Signed)
Follow up in 6 months 

## 2015-06-08 ENCOUNTER — Other Ambulatory Visit: Payer: Self-pay | Admitting: Pulmonary Disease

## 2015-06-10 NOTE — Telephone Encounter (Signed)
Please advise on refill. Thanks. 

## 2015-06-21 ENCOUNTER — Other Ambulatory Visit: Payer: Self-pay | Admitting: Pulmonary Disease

## 2015-06-30 ENCOUNTER — Telehealth: Payer: Self-pay | Admitting: Neurology

## 2015-06-30 ENCOUNTER — Encounter: Payer: Self-pay | Admitting: Neurology

## 2015-06-30 ENCOUNTER — Ambulatory Visit (INDEPENDENT_AMBULATORY_CARE_PROVIDER_SITE_OTHER): Payer: BLUE CROSS/BLUE SHIELD | Admitting: Neurology

## 2015-06-30 VITALS — BP 154/96 | HR 56 | Resp 14 | Ht 72.0 in | Wt 205.6 lb

## 2015-06-30 DIAGNOSIS — R51 Headache: Secondary | ICD-10-CM | POA: Diagnosis not present

## 2015-06-30 DIAGNOSIS — F329 Major depressive disorder, single episode, unspecified: Secondary | ICD-10-CM

## 2015-06-30 DIAGNOSIS — G2581 Restless legs syndrome: Secondary | ICD-10-CM

## 2015-06-30 DIAGNOSIS — F418 Other specified anxiety disorders: Secondary | ICD-10-CM | POA: Diagnosis not present

## 2015-06-30 DIAGNOSIS — F419 Anxiety disorder, unspecified: Secondary | ICD-10-CM

## 2015-06-30 DIAGNOSIS — M542 Cervicalgia: Secondary | ICD-10-CM | POA: Insufficient documentation

## 2015-06-30 DIAGNOSIS — G4700 Insomnia, unspecified: Secondary | ICD-10-CM

## 2015-06-30 DIAGNOSIS — R519 Headache, unspecified: Secondary | ICD-10-CM

## 2015-06-30 DIAGNOSIS — M549 Dorsalgia, unspecified: Secondary | ICD-10-CM | POA: Diagnosis not present

## 2015-06-30 DIAGNOSIS — F32A Depression, unspecified: Secondary | ICD-10-CM

## 2015-06-30 MED ORDER — GABAPENTIN 800 MG PO TABS: 800.0000 mg | ORAL_TABLET | Freq: Three times a day (TID) | ORAL | Status: DC

## 2015-06-30 MED ORDER — INDOMETHACIN 25 MG PO CAPS: ORAL_CAPSULE | ORAL | Status: DC

## 2015-06-30 NOTE — Telephone Encounter (Signed)
Mr. Jacob Ryan stated he forgot to ask Dr. Felecia Shelling if he will be handling the Ambien that was prescribed by Dr. Halford Chessman.  He didn't want to have to go to both doctors if he didn't need to.   He has an appt with Dr. Halford Chessman next week that he is hoping to be able to cx if Dr. Felecia Shelling is completely taking over his care.  It is the Ambien 12.5cr, Ambien 10mg  and Xanax 1mg  are the rx's that Dr. Halford Chessman has him on.  The best number to reach him is [phone].

## 2015-06-30 NOTE — Telephone Encounter (Signed)
I spoke with patient regarding scheduling his MRI. I informed him of his copay of $ 896.89. He wants to tal to DR. Sater in more detail to see if the MRI is a necessity since his copay is a lot more than he thought he would pay. He wants to discuss the importance of the MRI. Please call and advise patient . Patient can be reached @  [phone]

## 2015-06-30 NOTE — Progress Notes (Addendum)
[facility]  PATIENT: Jacob Ryan DOB: 12-06-1970  REFERRING DOCTOR OR PCP:  Delia Chimes (fax:  [phone]) SOURCE: records from Dr. Toy Cookey, records from regional neurology, sleep study report and data, MRI images and reports  _________________________________   HISTORICAL  CHIEF COMPLAINT:  Chief Complaint  Patient presents with  . Sleep Disturbance    Sts. for yrs. he has had difficulty falling asleep and staying asleep.  He had a sleep study at [facility] in 2013, and has that sleep study with him.  It did show minimal osa with desats to 86%, moderate snoring, moderate limb jerks.  He also c/o excessive daytime drowsiness.  Sts. he sees Dr. Sabra Heck and Dr. Wilford Grist at [facility] for chronic neck pain.  Sts. he would like to see Dr. Felecia Shelling for insomnia because "you are closer." Sts. he has taken Ambien for   . Sleep Apnea    yrs. and it no longer helps.  Sts. he has also tried Klonopin and Xanax with minimal relief./fim    HISTORY OF PRESENT ILLNESS:  I had the pleasure of seeing your patient, Jacob Ryan, for his sleep disturbances.  He has had insomnia x 20 years.  He started taking prn Ambien many years ago.   He eventually switched to Ambien 10 mg nightly.   Three years ago, he switched to Ambien CR 12.5.  Initially he slept through the night but over the past 18 months has developed a pattern where he takes Ambien CR at 7:50 pm and goes to bed around 07-1029 pm.  (AMbien CR does not help at all if he takes after he eats dinner).  He falls asleep easily but wakes up at 2-3 am.   He then takes an Ambien 10 mg.  If it does not help he takes either Xanax 0.5 or clonazepam 0.5.  Xanax will help better than clonazepam.   He averages 5-6 Xanax a month.   If he has a big dinner consistently at 8:30 pm, he has more difficulty with his sleep.      He has been on other medication that may sleep.   He was on gabapentin 300 mg  po tid but now takes only at night.   He tried gabapentin 600 mg at night with some benefit in the past but now only gets #30/month.   Trazodone did not help.    He has never tried doxepin, amitriptyline, imipramine or cyclobenzaprine at bedtime.   He has never tried clonazepam on a regular basis before bedtime (takes prn 1-2 times a month in middle of night if still awake).     He has neck / arm pain and sees Dr. Wilford Grist.   He is on Tegretol prn for pain and also takes gabapentin 300 mg nightly.    Gabapentin tid was poorly tolerated.   He had a PSG 05/15/12 at [facility]. I reviewed the report and hypnogram.  He had minimal OSA (AHI = 3.0) and moderate limb jerks (PLMSI = 31.0 with arousals PLMSAI = 11.6) with some arousals.      He also reports daily chronic headaches. He wakes up with mild HA's and they worsen as the day goes on.   He has chronic neck pain.   HA and neck are worse with head flexed/extended a while.   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   Pain also increases with lifting.   He has been on NSAID's without benefit.  MRI brain 08/02/2010 shows chronic sinusitis but brain was fine.   MRI Cervical spine 01/18/09 showed C4C5 facet hypertrophy and C5C6 / C6C7 foraminal narrowing.   I personally reviewed the MRI of the cervical spine.He has had facet blocks at multiple levels including C1C2, C2C2 and C3-C6 by Drs Marcial Pacas and Wilford Grist.  At some point he also had medial branch RFA.   He has had occipital nerve blocks.  None of these procedures helped much.     He notes some anxiety and also feels down at times. He was once placed on Prozac but had difficulty tolerating it and does not want to go back on antidepressant, he often replaced the events of the day and plans what he will be doing the next day. He feels this sometimes prevents him from falling asleep quickly. So, when he wakes up he often is thinking about the previous or next day.   There is no suicidal ideation or  plan.  Over the last year he is also noted some difficulty coming up with the right words at time. This does seem to be worse if he has slept poorly the previous night. The majority of the time, he can get words out without difficulty.  REVIEW OF SYSTEMS: Constitutional: No fevers, chills, sweats, or change in appetite.  He has insomnia and fatigue. Eyes: No visual changes, double vision, eye pain Ear, nose and throat: No hearing loss, ear pain, nasal congestion, sore throat Cardiovascular: No chest pain, palpitations Respiratory: No shortness of breath at rest or with exertion.   No wheezes GastrointestinaI: No nausea, vomiting, diarrhea, abdominal pain, fecal incontinence Genitourinary: No dysuria, urinary retention or frequency.  No nocturia. Musculoskeletal: he reports neck pain and mid back pain as described in detail above. Integumentary: No rash, pruritus, skin lesions Neurological: as above Psychiatric: as above Endocrine: No palpitations, diaphoresis, change in appetite, change in weigh or increased thirst Hematologic/Lymphatic: No anemia, purpura, petechiae. Allergic/Immunologic: No itchy/runny eyes, nasal congestion, recent allergic reactions, rashes  ALLERGIES: Allergies  Allergen Reactions  . Amoxicillin   . Pollen Extract   . Latex Rash    HOME MEDICATIONS:  Current outpatient prescriptions:  .  Butalbital-APAP-Caff-Cod 50-300-40-30 MG CAPS, , Disp: , Rfl:  .  carbamazepine (TEGRETOL) 200 MG tablet, Take 1 tablet by mouth 2 (two) times daily., Disp: , Rfl:  .  clonazePAM (KLONOPIN) 0.5 MG tablet, Take 0.5 mg by mouth., Disp: , Rfl:  .  DHA-EPA-VITAMIN E PO, 1,000 mg., Disp: , Rfl:  .  gabapentin (NEURONTIN) 300 MG capsule, Take 1 capsule by mouth daily., Disp: , Rfl:  .  Glucosamine-Chondroitin 500-400 MG CAPS, Frequency:   Dosage:0.0     Instructions:Glucosamine Chondr 1500 Complx ( CAPS, Oral )  Note:, Disp: , Rfl:  .  metoprolol succinate (TOPROL-XL) 50 MG 24  hr tablet, , Disp: , Rfl: 0 .  Multiple Vitamin (MULTI-VITAMINS) TABS, Frequency:   Dosage:0.0     Instructions:Multi Vitamin Mens ( TABS, Oral )  Note:, Disp: , Rfl:  .  zolpidem (AMBIEN CR) 12.5 MG CR tablet, Take 1 tablet (12.5 mg total) by mouth at bedtime as needed for sleep., Disp: 30 tablet, Rfl: 5 .  zolpidem (AMBIEN) 10 MG tablet, TAKE 1 TABLET BY MOUTH AT BEDTIME AS NEEDED FOR SLEEP, Disp: 30 tablet, Rfl: 1  PAST MEDICAL HISTORY: Past Medical History  Diagnosis Date  . Chronic headaches   . Diabetes mellitus without complication   . Hypertension   . Vision abnormalities  PAST SURGICAL HISTORY: History reviewed. No pertinent past surgical history.  FAMILY HISTORY: Family History  Problem Relation Age of Onset  . Lymphoma Mother   . Parkinsonism Father   . Healthy Sister     SOCIAL HISTORY:  Social History   Social History  . Marital Status: Divorced    Spouse Name: N/A  . Number of Children: N/A  . Years of Education: N/A   Occupational History  . Not on file.   Social History Main Topics  . Smoking status: Never Smoker   . Smokeless tobacco: Never Used  . Alcohol Use: No  . Drug Use: No  . Sexual Activity: Not on file   Other Topics Concern  . Not on file   Social History Narrative     PHYSICAL EXAM  Filed Vitals:   06/30/15 1019  BP: 154/96  Pulse: 56  Resp: 14  Height: 6' (1.829 m)  Weight: 205 lb 9.6 oz (93.26 kg)    Body mass index is 27.88 kg/(m^2).   General: The patient is well-developed and well-nourished and in no acute distress  Eyes:  Funduscopic exam shows normal optic discs and retinal vessels.  Neck: The neck is supple, no carotid bruits are noted.  The neck is tender over the occiput > mid cervical paraspinal muscles  Cardiovascular: The heart has a regular rate and rhythm with a normal S1 and S2. There were no murmurs, gallops or rubs. Lungs are clear to auscultation.  Skin: Extremities are without significant  edema.  Musculoskeletal:  Mid back is tender around T4-T7 and over rhomboids.    LB is less tender  Neurologic Exam  Mental status: The patient is alert and oriented x 3 at the time of the examination. The patient has apparent normal recent and remote memory, with an apparently normal attention span and concentration ability.   Speech is normal.  Cranial nerves: Extraocular movements are full. Pupils are equal, round, and reactive to light and accomodation.  Visual fields are full.  Facial symmetry is present. There is good facial sensation to soft touch bilaterally.Facial strength is normal.  Trapezius and sternocleidomastoid strength is normal. No dysarthria is noted.  The tongue is midline, and the patient has symmetric elevation of the soft palate. No obvious hearing deficits are noted.  Motor:  Muscle bulk is normal.   Tone is normal. Strength is  5 / 5 in all 4 extremities.   Sensory: Sensory testing is intact to pinprick, soft touch and vibration sensation in all 4 extremities.  Coordination: Cerebellar testing reveals good finger-nose-finger and heel-to-shin bilaterally.  Gait and station: Station is normal.   Gait is normal. Tandem gait is normal. Romberg is negative.   Reflexes: Deep tendon reflexes are symmetric and normal bilaterally.   Plantar responses are flexor.    DIAGNOSTIC DATA (LABS, IMAGING, TESTING) - I reviewed patient records, labs, notes, testing and imaging myself where available.  Lab Results  Component Value Date   WBC 6.6 07/02/2013   HGB 16.4 07/02/2013   HCT 46.6 07/02/2013   MCV 91.9 07/02/2013   PLT 183.0 07/02/2013      Component Value Date/Time   NA 139 07/02/2013 0941   K 3.9 07/02/2013 0941   CL 99 07/02/2013 0941   CO2 31 07/02/2013 0941   GLUCOSE 100* 07/02/2013 0941   BUN 16 07/02/2013 0941   CREATININE 1.2 07/02/2013 0941   CALCIUM 9.5 07/02/2013 0941   PROT 7.7 07/02/2013 0941   ALBUMIN 4.3 07/02/2013  0941   AST 26 07/02/2013 0941    ALT 28 07/02/2013 0941   ALKPHOS 102 07/02/2013 0941   BILITOT 0.6 07/02/2013 0941   No results found for: CHOL, HDL, LDLCALC, LDLDIRECT, TRIG, CHOLHDL No results found for: HGBA1C Lab Results  Component Value Date   VITAMINB12 658 07/02/2013   No results found for: TSH     ASSESSMENT AND PLAN  Insomnia  Restless legs  Chronic daily headache - Plan: [name]  Neck pain - Plan: [name]  Mid back pain  Anxiety and depression   In summary, Jacob Ryan is a 44 year old man with insomnia and restless leg syndrome/periodic limb movements of sleep.    Currently, despite taking Ambien twice most nights, he continues to have poor sleep. I will have him continue to take the Ambien CR 12.5 every night. Additionally, he will increase the gabapentin from 300 mg to 800 mg.     Consider adding cyclobenzaprine, imipramine or doxepin if insomnia is not better.  Anxiety seems to be playing some role in his insomnia and we could also consider clonazepam or Valium on a scheduled basis, though for now I will try to see if we can get his insomnia under control without a controlled substance.   He also has headache and neck pain that has been refractory to treatment.  I'll check an MRI of the neck to determine if he might benefit from facet blocks/RFA if we see a lot of spondylosis in the upper cervical spine.   I'll also add indomethacin with breakfast.      For the mid-back pain we will do a T4T5T6 paraspinal and rhomboid muscle trigger point injection using 80 mg Depo-Medrol in [place].      Mckynlee Luse A. Felecia Shelling, MD, PhD 11/27/9561, 87:56 AM Certified in Neurology, Clinical Neurophysiology, Sleep Medicine, Pain Medicine and Neuroimaging  [facility] [address] [place], [place] [address] [phone]

## 2015-06-30 NOTE — Telephone Encounter (Signed)
We can hold off on the MRI to see how he responds to the medications.

## 2015-07-01 NOTE — Telephone Encounter (Signed)
I spoke with patient this afternoon and informed him of Dr. Garth Bigness decision on waiting to see how he responds to his medications. Patient than stated that he thought the medications were for him sleeping not headaches.  He is requesting to speak with Dr. Felecia Shelling. Please call and advise. Patient can be reached @ [phone]

## 2015-07-01 NOTE — Telephone Encounter (Signed)
I have spoken with [place] this afternoon.  Per RAS, he increased Jadriel's Gabapentin to 800mg  qhs, which will hopefully help with sleep, and pt. will need less Ambien.  Per RAS,  he will rx. Ambien 12.5mg  CR #30, one po qhs prn, Ambien 10mg  #15, one po qhs prn, and will not be able to take over the Xanax rx.  I have spoken with [facility]--Ambien 10mg  rx. is due to be filled now, Ambien CR rx. will be due on 07-12-15.  Per Jenny Reichmann, he will leave rx's with the current physician rx'ing them for now.  Sts. she would not be able to drive to our office to pick them up monthly/fim

## 2015-07-05 ENCOUNTER — Telehealth: Payer: Self-pay | Admitting: Neurology

## 2015-07-05 NOTE — Telephone Encounter (Signed)
I reviewed the MRI of the brain dated 08/02/2010 performed at [facility]. The brain is normal. There is chronic maxillary and ethmoid sinusitis.

## 2015-07-06 ENCOUNTER — Telehealth: Payer: Self-pay | Admitting: Neurology

## 2015-07-06 ENCOUNTER — Ambulatory Visit (INDEPENDENT_AMBULATORY_CARE_PROVIDER_SITE_OTHER): Payer: BLUE CROSS/BLUE SHIELD | Admitting: Pulmonary Disease

## 2015-07-06 DIAGNOSIS — G4700 Insomnia, unspecified: Secondary | ICD-10-CM | POA: Diagnosis not present

## 2015-07-06 DIAGNOSIS — Z23 Encounter for immunization: Secondary | ICD-10-CM

## 2015-07-06 MED ORDER — ZOLPIDEM TARTRATE ER 12.5 MG PO TBCR: 12.5000 mg | EXTENDED_RELEASE_TABLET | Freq: Every evening | ORAL | Status: DC | PRN

## 2015-07-06 MED ORDER — ZOLPIDEM TARTRATE 10 MG PO TABS: 10.0000 mg | ORAL_TABLET | Freq: Every evening | ORAL | Status: DC | PRN

## 2015-07-06 NOTE — Progress Notes (Signed)
Chief Complaint  Patient presents with  . Follow-up    Pt states worsening insomnia since last visit. Pt stats problems falling asleep every night and is having to take multiple medications just to sleep a few hours.     History of Present Illness: Jacob Ryan is a 44 y.o. male with sleep disruption from chronic pain, insomnia, restless legs.  He continues to have trouble with his sleep.  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  He was seen by neurology.  He finds that taking extra neurontin helps his sleep, but only if it is the correct formulation.  TESTS: PSG 05/15/12 >> AHI 3, SpO2 low 86%, PLMI 27.2, UARS  Physical Exam: Deferred  Assessment/Plan:  Insomnia. Plan: - continue ambien CR qhs - he can take extra ambien if he has trouble falling back to sleep after awakening - he will speak with neurology about getting his neurontin prescription adjusted - defer any other changes to his medications for now >> re-assess once his job status improves and whether his sleep improves with adjustment to his neurontin  Anxiety. Plan: - discussed option of referral behavioral health >> he would like to defer this for now  Chronic neck/back pain. Plan: - f/u with neurology  Influenza vaccination given today.  Time spent: 43 minutes.   [name], MD [place] [facility]/Sleep Pager:  [phone]

## 2015-07-06 NOTE — Patient Instructions (Signed)
Follow up in 6 months 

## 2015-07-06 NOTE — Telephone Encounter (Signed)
Patient was called on 07/02/2015 to discuss the medications.

## 2015-07-06 NOTE — Telephone Encounter (Signed)
Patient called regarding  gabapentin (NEURONTIN) 800 MG tablet. Would like to change to take 2- 300mg  capsules at 8pm and a 3rd 300mg  capsule at 10:30pm vs 1- 800mg  tablet.

## 2015-07-07 MED ORDER — GABAPENTIN 300 MG PO CAPS: 300.0000 mg | ORAL_CAPSULE | Freq: Every day | ORAL | Status: DC | PRN

## 2015-07-07 NOTE — Telephone Encounter (Signed)
I have spoken with Jacob Ryan this morning and after an extended conversation, advised it is ok to take 600mg  Gabapentin at 8pm, and 900mg  Gabapentin at bedtime.  He is agreeable with this plan.  [facility]/fim

## 2015-07-14 ENCOUNTER — Encounter: Payer: Self-pay | Admitting: Pulmonary Disease

## 2015-09-27 ENCOUNTER — Telehealth: Payer: Self-pay | Admitting: *Deleted

## 2015-09-27 NOTE — Telephone Encounter (Signed)
I have spoken with Jacob Ryan this afternoon--he sees Dr. Sabra Heck and Dr. Wilford Grist at [facility] in [facility]).  He sts. he left a cd of mri c-spine with RAS at last ov and wants to know if Dr. Felecia Shelling has reviewed mri--he would like a 2nd opinion from Dr. Felecia Shelling on his chronic neck pain/daily h/a's./fim

## 2015-09-29 ENCOUNTER — Ambulatory Visit (INDEPENDENT_AMBULATORY_CARE_PROVIDER_SITE_OTHER): Payer: BLUE CROSS/BLUE SHIELD | Admitting: Neurology

## 2015-09-29 ENCOUNTER — Encounter: Payer: Self-pay | Admitting: Neurology

## 2015-09-29 VITALS — BP 124/84 | HR 57 | Ht 72.0 in | Wt 203.6 lb

## 2015-09-29 DIAGNOSIS — R51 Headache: Secondary | ICD-10-CM | POA: Diagnosis not present

## 2015-09-29 DIAGNOSIS — M47812 Spondylosis without myelopathy or radiculopathy, cervical region: Secondary | ICD-10-CM | POA: Diagnosis not present

## 2015-09-29 DIAGNOSIS — R519 Headache, unspecified: Secondary | ICD-10-CM

## 2015-09-29 DIAGNOSIS — G894 Chronic pain syndrome: Secondary | ICD-10-CM | POA: Diagnosis not present

## 2015-09-29 DIAGNOSIS — M542 Cervicalgia: Secondary | ICD-10-CM

## 2015-09-29 DIAGNOSIS — G2581 Restless legs syndrome: Secondary | ICD-10-CM | POA: Diagnosis not present

## 2015-09-29 DIAGNOSIS — M5489 Other dorsalgia: Secondary | ICD-10-CM | POA: Insufficient documentation

## 2015-09-29 DIAGNOSIS — G4700 Insomnia, unspecified: Secondary | ICD-10-CM

## 2015-09-29 DIAGNOSIS — M549 Dorsalgia, unspecified: Secondary | ICD-10-CM

## 2015-09-29 DIAGNOSIS — M546 Pain in thoracic spine: Secondary | ICD-10-CM

## 2015-09-29 MED ORDER — CYCLOBENZAPRINE HCL 5 MG PO TABS: 5.0000 mg | ORAL_TABLET | Freq: Three times a day (TID) | ORAL | Status: DC

## 2015-09-29 MED ORDER — TOPIRAMATE 25 MG PO TABS: 25.0000 mg | ORAL_TABLET | Freq: Every day | ORAL | Status: DC

## 2015-09-29 MED ORDER — GABAPENTIN 300 MG PO CAPS: 300.0000 mg | ORAL_CAPSULE | Freq: Every day | ORAL | Status: DC | PRN

## 2015-09-29 MED ORDER — TOPIRAMATE 25 MG PO TABS: 50.0000 mg | ORAL_TABLET | Freq: Every day | ORAL | Status: DC

## 2015-09-29 NOTE — Progress Notes (Signed)
[facility]  PATIENT: Jacob Ryan DOB: [birth year]  REFERRING DOCTOR OR PCP:  Delia Chimes (fax:  [phone]) SOURCE: records from Dr. Toy Cookey, records from regional neurology, sleep study report and data, MRI images and reports  _________________________________   HISTORICAL  CHIEF COMPLAINT:  Chief Complaint  Patient presents with  . Follow-up    Rm 14. F/u insomnia/neck pain. sleep has been pretty good. Neck feels ok today but has not been to work in 3 days. Has been sick. Pain mx by Dr Wilford Grist at [facility]:  I had the pleasure of seeing your patient, Jacob Ryan, for his sleep disturbances and chronic pain.   He did not have the MRI as his co-pay would have been $1100.  He would like to reschedule for January  Insomnia:    He is doing better.    He is taking 2 gabapentin 300 mg and the 12.5 Ambien CR at 8-830.   At 10 pm he takes 10 mg Ambien and another 300 mg gabapentin.   He is now sleeping the whole night through and waking up feeling more refreshed.   He has had chronic insomnia x 20 years and feels sleep is doing the best it has for many years.   In the past, he was falling asleep easily but wakes up at 2-3 am.   He then takes an Ambien 10 mg. He has not needed clonazepam or xanax since being on this regimen.     Trazodone did not help in the past.   He has never tried doxepin, amitriptyline, imipramine or cyclobenzaprine at bedtime.   He has never tried clonazepam on a regular basis before bedtime (takes prn 1-2 times a month in middle of night if still awake).     RLS/PLMS:   He had a PSG 05/15/12 at [facility]. I reviewed the report and hypnogram.  He had minimal OSA (AHI = 3.0) and moderate limb jerks (PLMSI = 31.0 with arousals PLMSAI = 11.6) with some arousals.    He feels RLS is better with gabapentin.  Pain:   His worse pain currently is in between the shoulder blades an in his neck.   He has neck /  arm pain and sees Dr. Wilford Grist.   He was on Tegretol for pain without benefit.   MRI Cervical spine 01/18/09 showed C4C5 facet hypertrophy and C5C6 / C6C7 foraminal narrowing.  He has had facet blocks at multiple levels including C1C2, C2C2 and C3-C6 by Drs Marcial Pacas and Wilford Grist.  At some point he also had medial branch RFA.   He has had occipital nerve blocks.  None of these procedures helped much.     TPIs at the last visit did not help.     He also has a lot of back pain.     Headache:   He also reports daily chronic headaches, often waking up with them HA and neck pain are worse with head flexed/extended a while.   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   Pain also increases with lifting which he has to do some at work.   He has been on NSAID's including indomethacin without benefit.   MRI brain 08/02/2010 shows chronic sinusitis but brain was fine.      He notes some anxiety and also feels down at times. He was once placed on Prozac but had difficulty tolerating it and does not want to go back on antidepressant, he often  replaced the events of the day and plans what he will be doing the next day. He feels this sometimes prevents him from falling asleep quickly. So, when he wakes up he often is thinking about the previous or next day.   There is no suicidal ideation or plan.  REVIEW OF SYSTEMS: Constitutional: No fevers, chills, sweats, or change in appetite.  He has insomnia and fatigue. Eyes: No visual changes, double vision, eye pain Ear, nose and throat: No hearing loss, ear pain, nasal congestion, sore throat Cardiovascular: No chest pain, palpitations Respiratory: No shortness of breath at rest or with exertion.   No wheezes GastrointestinaI: No nausea, vomiting, diarrhea, abdominal pain, fecal incontinence Genitourinary: No dysuria, urinary retention or frequency.  No nocturia. Musculoskeletal: he reports neck pain and mid back pain as described in detail above. Integumentary: No rash,  pruritus, skin lesions Neurological: as above Psychiatric: as above Endocrine: No palpitations, diaphoresis, change in appetite, change in weigh or increased thirst Hematologic/Lymphatic: No anemia, purpura, petechiae. Allergic/Immunologic: No itchy/runny eyes, nasal congestion, recent allergic reactions, rashes  ALLERGIES: Allergies  Allergen Reactions  . Amoxicillin   . Pollen Extract   . Latex Rash    HOME MEDICATIONS:  Current outpatient prescriptions:  .  carbamazepine (TEGRETOL) 200 MG tablet, Take 1 tablet by mouth 2 (two) times daily., Disp: , Rfl:  .  DHA-EPA-VITAMIN E PO, 1,000 mg., Disp: , Rfl:  .  gabapentin (NEURONTIN) 300 MG capsule, Take 1 capsule (300 mg total) by mouth 5 (five) times daily as needed. Take 2 capsules at 8pm and 3 capsules at bedtime., Disp: 150 capsule, Rfl: 3 .  Glucosamine-Chondroitin 500-400 MG CAPS, Frequency:   Dosage:0.0     Instructions:Glucosamine Chondr 1500 Complx ( CAPS, Oral )  Note:, Disp: , Rfl:  .  metoprolol succinate (TOPROL-XL) 50 MG 24 hr tablet, , Disp: , Rfl: 0 .  Multiple Vitamin (MULTI-VITAMINS) TABS, Frequency:   Dosage:0.0     Instructions:Multi Vitamin Mens ( TABS, Oral )  Note:, Disp: , Rfl:  .  zolpidem (AMBIEN CR) 12.5 MG CR tablet, Take 1 tablet (12.5 mg total) by mouth at bedtime as needed for sleep., Disp: 30 tablet, Rfl: 5 .  zolpidem (AMBIEN) 10 MG tablet, Take 1 tablet (10 mg total) by mouth at bedtime as needed. for sleep, Disp: 30 tablet, Rfl: 5 .  cefdinir (OMNICEF) 300 MG capsule, Take 300 mg by mouth daily., Disp: , Rfl: 0  PAST MEDICAL HISTORY: Past Medical History  Diagnosis Date  . Chronic headaches   . Diabetes mellitus without complication ([place])   . Hypertension   . Vision abnormalities     PAST SURGICAL HISTORY: History reviewed. No pertinent past surgical history.  FAMILY HISTORY: Family History  Problem Relation Age of Onset  . Lymphoma Mother   . Parkinsonism Father   . Healthy Sister      SOCIAL HISTORY:  Social History   Social History  . Marital Status: Divorced    Spouse Name: N/A  . Number of Children: N/A  . Years of Education: N/A   Occupational History  . Not on file.   Social History Main Topics  . Smoking status: Never Smoker   . Smokeless tobacco: Never Used  . Alcohol Use: No  . Drug Use: No  . Sexual Activity: Not on file   Other Topics Concern  . Not on file   Social History Narrative     PHYSICAL EXAM  Filed Vitals:   09/29/15 [phone]  BP: 124/84  Pulse: 57  Height: 6' (1.829 m)  Weight: 203 lb 9.6 oz (92.352 kg)    Body mass index is 27.61 kg/(m^2).   General: The patient is well-developed and well-nourished and in no acute distress  Neck: The neck is supple .  The neck is tender over the occiput > mid cervical paraspinal muscles   Skin: Extremities are without significant edema.  Musculoskeletal:  Mid back is tender around T4-T7.    LB is less tender  Neurologic Exam  Mental status: The patient is alert and oriented x 3 at the time of the examination. The patient has apparent normal recent and remote memory, with an apparently normal attention span and concentration ability.   Speech is normal.  Cranial nerves: Extraocular movements are full. Pupils are equal, round, and reactive to light and accomodation.  Visual fields are full.  Facial symmetry is present. There is good facial sensation to soft touch bilaterally.Facial strength is normal.  Trapezius and sternocleidomastoid strength is normal. No dysarthria is noted.  The tongue is midline, and the patient has symmetric elevation of the soft palate. No obvious hearing deficits are noted.  Motor:  Muscle bulk is normal.   Tone is normal. Strength is  5 / 5 in all 4 extremities.   Sensory: Sensory testing is intact to touch and vibration sensation in all 4 extremities.  Coordination: Cerebellar testing reveals good finger-nose-finger and heel-to-shin bilaterally.  Gait and  station: Station is normal.   Gait is normal. Tandem gait is normal. Romberg is negative.   Reflexes: Deep tendon reflexes are symmetric and normal bilaterally.       DIAGNOSTIC DATA (LABS, IMAGING, TESTING) - I reviewed patient records, labs, notes, testing and imaging myself where available.  Lab Results  Component Value Date   WBC 6.6 07/02/2013   HGB 16.4 07/02/2013   HCT 46.6 07/02/2013   MCV 91.9 07/02/2013   PLT 183.0 07/02/2013      Component Value Date/Time   NA 139 07/02/2013 0941   K 3.9 07/02/2013 0941   CL 99 07/02/2013 0941   CO2 31 07/02/2013 0941   GLUCOSE 100* 07/02/2013 0941   BUN 16 07/02/2013 0941   CREATININE 1.2 07/02/2013 0941   CALCIUM 9.5 07/02/2013 0941   PROT 7.7 07/02/2013 0941   ALBUMIN 4.3 07/02/2013 0941   AST 26 07/02/2013 0941   ALT 28 07/02/2013 0941   ALKPHOS 102 07/02/2013 0941   BILITOT 0.6 07/02/2013 0941   No results found for: CHOL, HDL, LDLCALC, LDLDIRECT, TRIG, CHOLHDL No results found for: HGBA1C Lab Results  Component Value Date   VITAMINB12 658 07/02/2013   No results found for: TSH     ASSESSMENT AND PLAN  Insomnia  Chronic pain syndrome  Chronic daily headache  Mid back pain - Plan: MR Thoracic Spine Wo Contrast  Neck pain - Plan: [name]  Restless legs  Cervical spondylosis without myelopathy - Plan: [name]  Midline thoracic back pain - Plan: MR Thoracic Spine Wo Contrast    1.   Continue night time medical regimen Ambien CR 12.5 followed by Ambien 10 mg and gabapentin with each.    2.   MRI of the cervical spine to determine if he might benefit from facet blocks/RFA if we see a lot of spondylosis in the upper cervical spine or disc changes.    MRI thoracic for intractable mid back pain 3.   topiramate for chronic  headache.   If not better , switch to imipramine 4.   rtc 4 months, sooner if problems  Richard A. Felecia Shelling, MD, PhD [id number], [id number] AM Certified  in Neurology, Clinical Neurophysiology, Sleep Medicine, Pain Medicine and Neuroimaging  [facility] [address], [place], [place] [address] [phone]

## 2015-10-24 HISTORY — PX: NASAL SINUS SURGERY: SHX719

## 2015-10-27 ENCOUNTER — Ambulatory Visit (INDEPENDENT_AMBULATORY_CARE_PROVIDER_SITE_OTHER): Payer: BLUE CROSS/BLUE SHIELD

## 2015-10-27 DIAGNOSIS — M546 Pain in thoracic spine: Secondary | ICD-10-CM

## 2015-10-27 DIAGNOSIS — M47812 Spondylosis without myelopathy or radiculopathy, cervical region: Secondary | ICD-10-CM | POA: Diagnosis not present

## 2015-10-27 DIAGNOSIS — M549 Dorsalgia, unspecified: Secondary | ICD-10-CM

## 2015-10-27 DIAGNOSIS — M542 Cervicalgia: Secondary | ICD-10-CM

## 2015-10-28 ENCOUNTER — Telehealth: Payer: Self-pay | Admitting: *Deleted

## 2015-10-28 NOTE — Telephone Encounter (Signed)
I have spoken with Jacob Ryan this morning, and per RAS, advised that mri c-spine showed mild degen changes at 3 levels, mri t-spine showed mild degen changes at one level; no nerve root compression noted at any level.  He verbalized understanding of same/fim

## 2015-10-28 NOTE — Telephone Encounter (Signed)
-----   Message from Britt Bottom, MD sent at 10/27/2015  5:43 PM EST ----- Please let him know that the MRI of the cervical spine shows mild degenerative changes at 3 levels and the MRI of the thoracic spine showed mild degenerative changes at one level. There was no nerve root compression at any of the levels.

## 2015-11-01 ENCOUNTER — Ambulatory Visit: Payer: BLUE CROSS/BLUE SHIELD | Admitting: Gastroenterology

## 2015-11-02 ENCOUNTER — Ambulatory Visit (INDEPENDENT_AMBULATORY_CARE_PROVIDER_SITE_OTHER): Payer: BLUE CROSS/BLUE SHIELD | Admitting: Gastroenterology

## 2015-11-02 ENCOUNTER — Encounter: Payer: Self-pay | Admitting: Gastroenterology

## 2015-11-02 VITALS — BP 122/80 | HR 84 | Ht 72.0 in | Wt 199.4 lb

## 2015-11-02 DIAGNOSIS — K219 Gastro-esophageal reflux disease without esophagitis: Secondary | ICD-10-CM | POA: Insufficient documentation

## 2015-11-02 DIAGNOSIS — R6881 Early satiety: Secondary | ICD-10-CM | POA: Diagnosis not present

## 2015-11-02 DIAGNOSIS — R110 Nausea: Secondary | ICD-10-CM | POA: Diagnosis not present

## 2015-11-02 MED ORDER — ONDANSETRON 8 MG PO TBDP: 8.0000 mg | ORAL_TABLET | Freq: Three times a day (TID) | ORAL | Status: DC | PRN

## 2015-11-02 NOTE — Patient Instructions (Signed)
We have sent the following medications to your pharmacy for you to pick up at your convenience: Zofran  Continue taking OTC Omeprazole 20mg  BID.

## 2015-11-02 NOTE — Progress Notes (Signed)
HPI :  45 y/o male here in consultation for nausea and other upper tract symptoms.   He reports when he wakes in the morning he has nausea. He reports he has to eat small bites and eat slowly or he will vomit. He reports feeling very queezy in general. Symptoms worse in the morning rather than later in the day. Symptoms previously were mild and he was fine later in the day and was able to function. Over time things have progressed and his symptoms have been bothering him most of the day. He has lost 8 lbs over the past 6 weeks due to symptoms. He will eat very small meals to minimize symptoms. He thinks symptoms have been going on for the past 6 months, but progresively worsening.   He denies any abdominal pain. No dysphagia. No odynophagia. He does endorse some sense of early satiety. He denies NSAID use at this time, previously on indocin but stopped this and no improvement. He has recently been taking a course of Biaxin for a URI / sinusitis, but is not on any long term antibiotics. No prior upper endoscopy. He does endorse heartburn / pyrosis which has bothered him recently. He started taking omeprazole 20mg  q day for the past 2 weeks which has not helped this symptom much.   No marijuana, or other drug use. Over the past 6 months, flexeril is new for him. He has stopped topamax for the past 2 weeks and has not provided any improvement. He doesn't take tegretol as prescribed, has used it for headaches in the past. He has been on toprol XL for his HTN in the past 6 months. He endorses a very busy job and is very stressed at his job.   He reports significant insomnia, taking ambien and gabapentin prior to bedtime.  He has been on these for a while.     Past Medical History  Diagnosis Date  . Chronic headaches   . Diabetes mellitus without complication ([place])   . Hypertension   . Vision abnormalities   . Insomnia   . Chronic neck and back pain     History reviewed. No pertinent past  surgical history. Family History  Problem Relation Age of Onset  . Lymphoma Mother   . Parkinsonism Father   . Healthy Sister    Social History  Substance Use Topics  . Smoking status: Never Smoker   . Smokeless tobacco: Never Used  . Alcohol Use: No   Current Outpatient Prescriptions  Medication Sig Dispense Refill  . buPROPion (WELLBUTRIN XL) 300 MG 24 hr tablet Take 300 mg by mouth every morning.  3  . carbamazepine (TEGRETOL) 200 MG tablet Take 1 tablet by mouth 2 (two) times daily.    . clarithromycin (BIAXIN) 500 MG tablet TAKE 1 TABLET BY MOUTH TWICE DAILY. TAKE WITH FOOD AND A PROBIOTIC  0  . clonazePAM (KLONOPIN) 0.5 MG tablet Take 0.5 mg by mouth 3 (three) times daily as needed.  5  . cyclobenzaprine (FLEXERIL) 5 MG tablet Take 1 tablet (5 mg total) by mouth 3 (three) times daily. 90 tablet 3  . DHA-EPA-VITAMIN E PO 1,000 mg.    . gabapentin (NEURONTIN) 300 MG capsule Take 1 capsule (300 mg total) by mouth 5 (five) times daily as needed. Take 2 capsules at 8pm and 3 capsules at bedtime. 150 capsule 5  . Glucosamine-Chondroitin 500-400 MG CAPS Frequency:   Dosage:0.0     Instructions:Glucosamine Chondr 1500 Complx ( CAPS, Oral )  Note:    . metoprolol succinate (TOPROL-XL) 50 MG 24 hr tablet   0  . Multiple Vitamin (MULTI-VITAMINS) TABS Frequency:   Dosage:0.0     Instructions:Multi Vitamin Mens ( TABS, Oral )  Note:    . zolpidem (AMBIEN CR) 12.5 MG CR tablet Take 1 tablet (12.5 mg total) by mouth at bedtime as needed for sleep. 30 tablet 5  . zolpidem (AMBIEN) 10 MG tablet Take 1 tablet (10 mg total) by mouth at bedtime as needed. for sleep 30 tablet 5  . ondansetron (ZOFRAN ODT) 8 MG disintegrating tablet Take 1 tablet (8 mg total) by mouth every 8 (eight) hours as needed for nausea or vomiting. 30 tablet 1   No current facility-administered medications for this visit.   Allergies  Allergen Reactions  . Amoxicillin   . Other     Feathers  . Pollen Extract   . Latex  Rash     Review of Systems: All systems reviewed and negative except where noted in HPI.   Recent CMP - scanned into Epic, normal LFTs  [name]  10/27/2015   [facility] [address] [place], [place] [address] [phone] NEUROIMAGING REPORT STUDY DATE: 08/26/2016 PATIENT NAME: Jacob Ryan DOB: [birth year] MRN: [id number] EXAM: MRI of the cervical spine ORDERING CLINICIAN: Richard A. Sater, MD. PhD CLINICAL HISTORY: 46 year old man with neck pain and cervical spondylosis COMPARISON FILMS: None TECHNIQUE: MRI of the cervical spine was obtained utilizing 3 mm sagittal slices from the posterior fossa down to the T3-4 level with T1, T2 and inversion recovery views. In addition 4 mm axial slices from [id number] down to T1-2 level were included with T2 and gradient echo views. CONTRAST: None IMAGING SITE: [facility], [address] [place], [place] [address] FINDINGS: :  On sagittal images, the spine is imaged from above the cervicomedullary junction to T2.   The spinal cord is of normal caliber and signal.   The vertebral bodies are normally aligned.   The vertebral bodies have normal signal.  The discs and interspaces were further evaluated on axial views from C2 to T1 as follows: C2 - C3:  The disc and interspace appear normal. C3 - C4:  There is mild uncovertebral spurring bilaterally. There is mild right foraminal narrowing. There does not appear to be nerve root impingement. C4 - C5:  There is mild left greater than right uncovertebral spurring and bilateral facet hypertrophy. There is minimal foraminal narrowing. There does not appear to be any nerve root impingement.. C5 - C6:  The disc and interspace appear normal. C6 - C7:  There is minimal facet hypertrophy. The neural foramina are minimally narrowed but there does not appear to be nerve root impingement.. C7 - T1:  The disc and interspace appear normal.   10/27/2015   This MRI of the  cervical spine shows mild degenerative changes at C3-C4, C4-C5 and C6-C7 that did not appear to lead to any nerve root impingement. INTERPRETING PHYSICIAN: Richard A. Felecia Shelling, MD, PhD Certified in  Neuroimaging by [employer]   Mr Thoracic Spine Wo Contrast  10/27/2015  [facility] [address], [place], [place] [address] [phone] NEUROIMAGING REPORT STUDY DATE: 10/27/2015 PATIENT NAME: Jacob Ryan DOB: Oct 31, 1970 MRN: [id number] EXAM: MRI of the thoracic spine ORDERING CLINICIAN: Richard A. Felecia Shelling, MD. PhD CLINICAL HISTORY: 45 year old man with mid back pain COMPARISON FILMS: None TECHNIQUE: MRI of the thoracic spine was obtained  utilizing 3 mm sagittal slices from the posterior fossa from C7 to L1 level with T1, T2 and inversion recovery views. In addition 4 mm axial slices from [id number] to [id number] level were included with T2 and gradient echo views. CONTRAST: None IMAGING SITE: [facility], [address] [place], [place] [address] FINDINGS: :  The spine is imaged from C6-C7 to T12-L1..  The spinal cord is of normal caliber and signal.   The vertebral bodies are normally aligned.   There is a small chronic Schmorl's node in the inferior endplates of [id number]. Otherwise, the vertebral bodies have normal signal.  The discs and interspaces were further evaluated on axial views from C7-L1. There are no significant degenerative changes noted. However, there is a small Schmorl's node at T11-T12.. There is no nerve root impingement.   10/27/2015   This MRI of the thoracic spine shows minimal degenerative changes at T11-T12 where there is a Schmorl's node. There is no disc protrusion or nerve root impingement at any level. INTERPRETING PHYSICIAN: Richard A. Sater, MD, PhD Certified in  Neuroimaging by [employer]    Physical Exam: BP 122/80 mmHg  Pulse 84  Ht 6' (1.829 m)  Wt 199 lb 6 oz (90.436 kg)  BMI 27.03 kg/m2 Constitutional:  Pleasant,well-developed, male in no acute distress. HEENT: Normocephalic and atraumatic. Conjunctivae are normal. No scleral icterus. Neck supple.  Cardiovascular: Normal rate, regular rhythm.  Pulmonary/chest: Effort normal and breath sounds normal. No wheezing, rales or rhonchi. Abdominal: Soft, nondistended, nontender. Bowel sounds active throughout. There are no masses palpable. No hepatomegaly. Extremities: no edema Lymphadenopathy: No cervical adenopathy noted. Neurological: Alert and oriented to person place and time. Skin: Skin is warm and dry. No rashes noted. Psychiatric: Normal mood and affect. Behavior is normal.   ASSESSMENT AND PLAN: 45 y/o male with PMH of chronic neck pain and headaches, presenting with 6 months worth of progressive nausea, and now with early satiety.   We discussed the differential is wide for nausea, and reviewed his medication list at length. The timeline of starting Toprol fits the onset of his symptoms but this is associated with nausea very rarely. That being said he could touch base with his primary physician to see if he can hold it for a week or two and see if he notes any improvement. Other changes in medications which could have played a role have not provided any relief as outlined above. He does have chronic headaches which could be playing a role if they are poorly controlled and is followed by Neurology for this. He otherwise does have what sounds like fairly frequent reflux symptoms. Recommend we increase his omeprazole from 20mg  daily to BID dosing, and will give him some zofran to use in the mornings to help with his nausea. I otherwise offered him an upper endoscopy to further evaluate these symptoms, rule out mucosal abnormalities /  H pylori, and ensure normal given some of his early satiety and weight loss.   The indications, risks, and benefits of EGD were explained to the patient in detail. Risks include but are not limited to bleeding,  perforation, adverse reaction to medications, and cardiopulmonary compromise. Sequelae include but are not limited to the possibility of surgery, hospitalization, and mortality. The patient verbalized understanding and wished to proceed. All questions answered, referred to scheduler. Further recommendations pending results of the exam.   Falmouth Cellar, MD [facility] Pager [phone]  CC: Delia Chimes, NP

## 2015-11-03 ENCOUNTER — Other Ambulatory Visit: Payer: Self-pay

## 2015-11-03 DIAGNOSIS — R110 Nausea: Secondary | ICD-10-CM

## 2015-11-03 DIAGNOSIS — K219 Gastro-esophageal reflux disease without esophagitis: Secondary | ICD-10-CM

## 2015-11-17 ENCOUNTER — Ambulatory Visit (AMBULATORY_SURGERY_CENTER): Payer: BLUE CROSS/BLUE SHIELD | Admitting: Gastroenterology

## 2015-11-17 ENCOUNTER — Encounter: Payer: Self-pay | Admitting: Gastroenterology

## 2015-11-17 VITALS — BP 136/91 | HR 63 | Temp 98.6°F | Resp 15 | Ht 72.0 in | Wt 199.0 lb

## 2015-11-17 DIAGNOSIS — K317 Polyp of stomach and duodenum: Secondary | ICD-10-CM | POA: Diagnosis not present

## 2015-11-17 DIAGNOSIS — R110 Nausea: Secondary | ICD-10-CM

## 2015-11-17 HISTORY — PX: UPPER GI ENDOSCOPY: SHX6162

## 2015-11-17 MED ORDER — SODIUM CHLORIDE 0.9 % IV SOLN: 500.0000 mL | INTRAVENOUS | Status: DC

## 2015-11-17 NOTE — Op Note (Signed)
[place]  [employer]. [place], [address]   ENDOSCOPY PROCEDURE REPORT  PATIENT: Jacob, Ryan  MR#: [id number] BIRTHDATE: 01-Feb-1971 , 61  yrs. old GENDER: male ENDOSCOPIST: Yetta Flock, MD REFERRED BY: PROCEDURE DATE:  11/17/2015 PROCEDURE:  EGD w/ biopsy ASA CLASS:     Class II INDICATIONS:  nausea and early satiety. MEDICATIONS: Propofol 150 mg IV and Lidocaine 40 mg IV TOPICAL ANESTHETIC:  DESCRIPTION OF PROCEDURE: After the risks benefits and alternatives of the procedure were thoroughly explained, informed consent was obtained.  The [id number] endoscope was introduced through the mouth and advanced to the second portion of the duodenum , Without limitations.  The instrument was slowly withdrawn as the mucosa was fully examined.   FINDINGS: The esophagus was normal in appearance.  DH, GEJ, and SCJ located 42cm from the incisors.  There was mild patchy erythema noted in the gastric body without focal ulceration or erosions. Biopsies taken from the antrum and body to rule out H pylori. 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  2 polyps were removed as a representative sample. The dudodenal bulb and 2nd portion of the duodenum were otherwise normal.  Retroflexed views revealed no abnormalities.     The scope was then withdrawn from the patient and the procedure completed.  COMPLICATIONS: There were no immediate complications.  ENDOSCOPIC IMPRESSION: Normal esophagus Benign appearing gastric polyps as outlined above, representative sample taken Mild gastric erythema, biopsies obtained Normal duodenum    RECOMMENDATIONS: Await pathology results Resume diet Resume medications  eSigned:  Yetta Flock, MD 11/17/2015 4:20 PM    CC: the patient  PATIENT NAME:  Jacob, Ryan MR#: [id number]

## 2015-11-17 NOTE — Patient Instructions (Signed)
YOU HAD AN ENDOSCOPIC PROCEDURE TODAY AT THE [facility]:   Refer to the procedure report that was given to you for any specific questions about what was found during the examination.  If the procedure report does not answer your questions, please call your gastroenterologist to clarify.  If you requested that your care partner not be given the details of your procedure findings, then the procedure report has been included in a sealed envelope for you to review at your convenience later.  YOU SHOULD EXPECT: Some feelings of bloating in the abdomen. Passage of more gas than usual.  Walking can help get rid of the air that was put into your GI tract during the procedure and reduce the bloating. If you had a lower endoscopy (such as a colonoscopy or flexible sigmoidoscopy) you may notice spotting of blood in your stool or on the toilet paper. If you underwent a bowel prep for your procedure, you may not have a normal bowel movement for a few days.  Please Note:  You might notice some irritation and congestion in your nose or some drainage.  This is from the oxygen used during your procedure.  There is no need for concern and it should clear up in a day or so.  SYMPTOMS TO REPORT IMMEDIATELY:    Following upper endoscopy (EGD)  Vomiting of blood or coffee ground material  New chest pain or pain under the shoulder blades  Painful or persistently difficult swallowing  New shortness of breath  Fever of 100F or higher  Black, tarry-looking stools  For urgent or emergent issues, a gastroenterologist can be reached at any hour by calling [phone].   DIET: Your first meal following the procedure should be a small meal and then it is ok to progress to your normal diet. Heavy or fried foods are harder to digest and may make you feel nauseous or bloated.  Likewise, meals heavy in dairy and vegetables can increase bloating.  Drink plenty of fluids but you should avoid alcoholic beverages  for 24 hours.  ACTIVITY:  You should plan to take it easy for the rest of today and you should NOT DRIVE or use heavy machinery until tomorrow (because of the sedation medicines used during the test).    FOLLOW UP: Our staff will call the number listed on your records the next business day following your procedure to check on you and address any questions or concerns that you may have regarding the information given to you following your procedure. If we do not reach you, we will leave a message.  However, if you are feeling well and you are not experiencing any problems, there is no need to return our call.  We will assume that you have returned to your regular daily activities without incident.  If any biopsies were taken you will be contacted by phone or by letter within the next 1-3 weeks.  Please call us at [phone] if you have not heard about the biopsies in 3 weeks.    SIGNATURES/CONFIDENTIALITY: You and/or your care partner have signed paperwork which will be entered into your electronic medical record.  These signatures attest to the fact that that the information above on your After Visit Summary has been reviewed and is understood.  Full responsibility of the confidentiality of this discharge information lies with you and/or your care-partner. 

## 2015-11-17 NOTE — Progress Notes (Signed)
Called to room to assist during endoscopic procedure.  Patient ID and intended procedure confirmed with present staff. Received instructions for my participation in the procedure from the performing physician.  

## 2015-11-17 NOTE — Progress Notes (Signed)
Stable to RR 

## 2015-11-18 ENCOUNTER — Telehealth: Payer: Self-pay | Admitting: *Deleted

## 2015-11-18 NOTE — Telephone Encounter (Signed)
  Follow up Call-no answer, left message to call if questions or concerns.     

## 2015-11-25 ENCOUNTER — Other Ambulatory Visit: Payer: Self-pay | Admitting: *Deleted

## 2015-11-25 DIAGNOSIS — R6881 Early satiety: Secondary | ICD-10-CM

## 2015-12-14 ENCOUNTER — Ambulatory Visit (HOSPITAL_COMMUNITY): Payer: BLUE CROSS/BLUE SHIELD

## 2015-12-30 ENCOUNTER — Other Ambulatory Visit: Payer: Self-pay | Admitting: Pulmonary Disease

## 2016-01-03 ENCOUNTER — Telehealth: Payer: Self-pay | Admitting: Pulmonary Disease

## 2016-01-03 MED ORDER — ZOLPIDEM TARTRATE 10 MG PO TABS: 10.0000 mg | ORAL_TABLET | Freq: Every evening | ORAL | Status: DC | PRN

## 2016-01-03 MED ORDER — ZOLPIDEM TARTRATE ER 12.5 MG PO TBCR: 12.5000 mg | EXTENDED_RELEASE_TABLET | Freq: Every evening | ORAL | Status: DC | PRN

## 2016-01-03 NOTE — Telephone Encounter (Signed)
Request refill of Ambien CR 12.5mg  and Ambien 10mg  Per last OV notes:  Insomnia. Plan: - continue ambien CR qhs - he can take extra ambien if he has trouble falling back to sleep after awakening  Rx called in to [facility].  Pt aware to keep upcoming visit with VS 01/2016. Nothing further needed.

## 2016-02-01 ENCOUNTER — Other Ambulatory Visit: Payer: Self-pay | Admitting: Pulmonary Disease

## 2016-02-02 ENCOUNTER — Ambulatory Visit (INDEPENDENT_AMBULATORY_CARE_PROVIDER_SITE_OTHER): Payer: BLUE CROSS/BLUE SHIELD | Admitting: Pulmonary Disease

## 2016-02-02 ENCOUNTER — Encounter: Payer: Self-pay | Admitting: Pulmonary Disease

## 2016-02-02 VITALS — BP 122/84 | HR 58 | Ht 72.0 in | Wt 197.8 lb

## 2016-02-02 DIAGNOSIS — G4700 Insomnia, unspecified: Secondary | ICD-10-CM

## 2016-02-02 MED ORDER — SUVOREXANT 10 MG PO TABS: 10.0000 mg | ORAL_TABLET | Freq: Every day | ORAL | Status: DC

## 2016-02-02 MED ORDER — ZOLPIDEM TARTRATE 10 MG PO TABS: 10.0000 mg | ORAL_TABLET | Freq: Every evening | ORAL | Status: DC | PRN

## 2016-02-02 MED ORDER — ZOLPIDEM TARTRATE ER 12.5 MG PO TBCR: 12.5000 mg | EXTENDED_RELEASE_TABLET | Freq: Every day | ORAL | Status: DC

## 2016-02-02 NOTE — Progress Notes (Addendum)
Current Outpatient Prescriptions on File Prior to Visit  Medication Sig  . clonazePAM (KLONOPIN) 0.5 MG tablet Take 0.5 mg by mouth 3 (three) times daily as needed.  . cyclobenzaprine (FLEXERIL) 5 MG tablet Take 1 tablet (5 mg total) by mouth 3 (three) times daily.  Marland Kitchen gabapentin (NEURONTIN) 300 MG capsule Take 1 capsule (300 mg total) by mouth 5 (five) times daily as needed. Take 2 capsules at 8pm and 3 capsules at bedtime.  . Glucosamine-Chondroitin 500-400 MG CAPS Frequency:   Dosage:0.0     Instructions:Glucosamine Chondr 1500 Complx ( CAPS, Oral )  Note:  . metoprolol succinate (TOPROL-XL) 50 MG 24 hr tablet   . Multiple Vitamin (MULTI-VITAMINS) TABS Frequency:   Dosage:0.0     Instructions:Multi Vitamin Mens ( TABS, Oral )  Note:  . zolpidem (AMBIEN CR) 12.5 MG CR tablet Take 1 tablet (12.5 mg total) by mouth at bedtime as needed for sleep.  Marland Kitchen zolpidem (AMBIEN) 10 MG tablet Take 1 tablet (10 mg total) by mouth at bedtime as needed for sleep.   No current facility-administered medications on file prior to visit.    Chief Complaint  Patient presents with  . Follow-up    Pt still having issues sleeping through the night about 50% of the week. Using Ambien nightly. Needs refills.    Tests PSG 05/15/12 >> AHI 3, SpO2 low 86%, PLMI 27.2, UARS  Past medical history HA, HTN, Chronic pain  Past surgical history, allergies, social history, family history reviewed.  Vital signs BP 122/84 mmHg  Pulse 58  Ht 6' (1.829 m)  Wt 197 lb 12.8 oz (89.721 kg)  BMI 26.82 kg/m2  SpO2 98%   History of Present Illness: Jacob Ryan is a 45 y.o. male with sleep disruption from chronic pain, insomnia, restless legs.  He felt great initially after neurotin and ambien were changed.  Now he gets good sleep about 1/2 of nights.  He feels trapped in his schedule.  He has to take his neurotin and ambien CR at ten minutes to 8 pm.  He has to then eat a meal at 830 pm.  He goes to sleep at 1030 pm  unless there is a good show on TV >> then he sleeps at 11 pm.  He will sometimes get up at 4 am, and then can't go back to sleep.  He then goes to work at 5 am, and works the whole day.  On the nights he can sleep until 8 am, then he feels great.  He had diabetes listed in his PMHx > he denies having hx of diabetes.   Physical Exam: Deferred.  Assessment/Plan:  Insomnia. Plan: - continue ambien CR qhs - he can take extra ambien if he has trouble falling back to sleep after awakening - he will speak with neurology about getting his neurontin prescription adjusted - continue neurotin per neurology - will try adding belsomra 10 mg qhs - explained realistic expectations from sleep, and role of sleep aide medications  Anxiety. Plan: - discussed option of referral behavioral health >> he would like to defer this for now  Chronic neck/back pain. Plan: - f/u with neurology   Time during interview: 43 minutes.  [name], MD [facility] Pager:  [phone] 02/02/2016, 10:20 AM

## 2016-02-02 NOTE — Patient Instructions (Signed)
Belsomra 10 mg >> take 30 minutes before bedtime  Follow up in 2 months

## 2016-02-03 NOTE — Telephone Encounter (Signed)
VS please advise on refill. Thanks. 

## 2016-04-03 ENCOUNTER — Ambulatory Visit (INDEPENDENT_AMBULATORY_CARE_PROVIDER_SITE_OTHER): Payer: BLUE CROSS/BLUE SHIELD | Admitting: Pulmonary Disease

## 2016-04-03 ENCOUNTER — Encounter: Payer: Self-pay | Admitting: Pulmonary Disease

## 2016-04-03 VITALS — BP 142/100 | HR 51 | Ht 72.0 in | Wt 196.8 lb

## 2016-04-03 DIAGNOSIS — R0683 Snoring: Secondary | ICD-10-CM

## 2016-04-03 DIAGNOSIS — G894 Chronic pain syndrome: Secondary | ICD-10-CM

## 2016-04-03 DIAGNOSIS — G4700 Insomnia, unspecified: Secondary | ICD-10-CM

## 2016-04-03 NOTE — Progress Notes (Signed)
Current Outpatient Prescriptions on File Prior to Visit  Medication Sig  . clonazePAM (KLONOPIN) 0.5 MG tablet Take 0.5 mg by mouth 3 (three) times daily as needed.  . cyclobenzaprine (FLEXERIL) 5 MG tablet Take 1 tablet (5 mg total) by mouth 3 (three) times daily.  Marland Kitchen gabapentin (NEURONTIN) 300 MG capsule Take 1 capsule (300 mg total) by mouth 5 (five) times daily as needed. Take 2 capsules at 8pm and 3 capsules at bedtime.  . Glucosamine-Chondroitin 500-400 MG CAPS Frequency:   Dosage:0.0     Instructions:Glucosamine Chondr 1500 Complx ( CAPS, Oral )  Note:  . metoprolol succinate (TOPROL-XL) 50 MG 24 hr tablet   . Multiple Vitamin (MULTI-VITAMINS) TABS Frequency:   Dosage:0.0     Instructions:Multi Vitamin Mens ( TABS, Oral )  Note:  . Suvorexant (BELSOMRA) 10 MG TABS Take 10 mg by mouth at bedtime.  Marland Kitchen zolpidem (AMBIEN CR) 12.5 MG CR tablet Take 1 tablet (12.5 mg total) by mouth at bedtime.  Marland Kitchen zolpidem (AMBIEN) 10 MG tablet Take 1 tablet (10 mg total) by mouth at bedtime as needed for sleep.   No current facility-administered medications on file prior to visit.    Chief Complaint  Patient presents with  . Follow-up    Pt states that when taking all 3 meds he feels heavilly sedated the next day. Pt states that with or without the Belsomra added he feels that same way - never rested and exhausted. Taking Belsomra, Ambien and Ambien CR.     Tests PSG 05/15/12 >> AHI 3, SpO2 low 86%, PLMI 27.2, UARS  Past medical history HA, HTN, Chronic pain  Past surgical history, allergies, social history, family history reviewed.  Vital signs BP 142/100 mmHg  Pulse 51  Ht 6' (1.829 m)  Wt 196 lb 12.8 oz (89.268 kg)  BMI 26.69 kg/m2  SpO2 98%   History of Present Illness: Jacob Ryan is a 45 y.o. male with sleep disruption from chronic pain, insomnia, restless legs.  He can sleep through the night when he takes ambien CR at 8 pm, then eat.  He then takes belsomra, regular ambien, and  neurontin at 10 pm.  His problem with this regimen is that he then feels groggy in the morning.  He tried cutting out 10 pm dose of ambien >> he can fall asleep, but then wakes up at 3 to 4 am.  Regardless of regimen he feels tired throughout the day.  He has noticed more trouble with his memory and focus.  His girlfriend has told him his snoring has gotten worse, and he will stop breathing at times.  He is sleeping more often on his back >> snoring worse.  Physical Exam: General: pleasant HEENT: no sinus tenderness, MP 4, enlarged tongue with scalloped border, no LAN Cardiac: regular, no murmur Chest: no wheeze, rales Abd: soft, non tender Ext: no edema Neuro: CN intact, normal strength Skin: no rashes  Assessment/Plan:  Insomnia. - reviewed sleep restriction, stimulus control and relaxation techniques - reviewed good sleep hygiene habits - continue ambien CR at 8 pm, belsomra/ambien 10 mg at 10pm until after review of his sleep study  Snoring, sleep disruption, daytime sleepiness. - he has prior sleep study which showed UARS >> his symptoms seem to have progressed - will arrange for home sleep study pending insurance approval >> otherwise will need to have in lab sleep study  Anxiety. - discussed option of referral behavioral health >> he would like to defer this  for now  Chronic neck/back pain. Memory loss. - f/u with neurology    [name], MD [place] [facility]/Sleep Pager:  [phone] 04/03/2016, 9:54 AM

## 2016-04-03 NOTE — Patient Instructions (Signed)
Will schedule home sleep study  Follow up in 3 months

## 2016-04-12 ENCOUNTER — Telehealth: Payer: Self-pay | Admitting: Pulmonary Disease

## 2016-04-12 DIAGNOSIS — R0683 Snoring: Secondary | ICD-10-CM

## 2016-04-12 DIAGNOSIS — G4710 Hypersomnia, unspecified: Secondary | ICD-10-CM

## 2016-04-12 NOTE — Telephone Encounter (Signed)
I have placed order for split night sleep study.  Please inform pt that his insurance is requiring that sleep study be done in sleep lab.  Please advise him that he can take his sleep aide medications as usual on the night of the sleep study.

## 2016-04-12 NOTE — Telephone Encounter (Signed)
-----   Message from Joellen Jersey sent at 04/11/2016 11:14 AM EDT ----- Got a denial from [facility] for the [place] i will need order for Split night study thanks libby

## 2016-04-14 NOTE — Telephone Encounter (Signed)
Pt states that he was scheduled for split night at [facility] sleep lab 05/20/16. Nothing further needed.

## 2016-05-17 ENCOUNTER — Telehealth: Payer: Self-pay | Admitting: Pulmonary Disease

## 2016-05-17 NOTE — Telephone Encounter (Signed)
Spoke with [employer], gave code for SST [phone] and she states it is covered at 100%. Spoke with pt and explained benefits per [employer]. He is confused because someone else at [facility] told him it would only be covered at 70/30 and he would owe $1000. He is concerned that he is not getting any straight answers regarding his out of pocket expense. I explained that we do not do the billing and coding, the sleep lab does, therefore he may need to contact them to discuss further benefit information. Pt says he will call us if he needs anything else or wants to change location of sleep study. Nothing further needed at this time.

## 2016-05-20 ENCOUNTER — Ambulatory Visit (HOSPITAL_BASED_OUTPATIENT_CLINIC_OR_DEPARTMENT_OTHER): Payer: BLUE CROSS/BLUE SHIELD | Attending: Pulmonary Disease | Admitting: Pulmonary Disease

## 2016-05-20 VITALS — Ht 72.0 in | Wt 195.0 lb

## 2016-05-20 DIAGNOSIS — G4761 Periodic limb movement disorder: Secondary | ICD-10-CM | POA: Diagnosis not present

## 2016-05-20 DIAGNOSIS — G4710 Hypersomnia, unspecified: Secondary | ICD-10-CM | POA: Insufficient documentation

## 2016-05-20 DIAGNOSIS — Z79899 Other long term (current) drug therapy: Secondary | ICD-10-CM | POA: Diagnosis not present

## 2016-05-20 DIAGNOSIS — G4736 Sleep related hypoventilation in conditions classified elsewhere: Secondary | ICD-10-CM | POA: Diagnosis not present

## 2016-05-20 DIAGNOSIS — R0683 Snoring: Secondary | ICD-10-CM | POA: Diagnosis not present

## 2016-05-20 DIAGNOSIS — G4733 Obstructive sleep apnea (adult) (pediatric): Secondary | ICD-10-CM | POA: Diagnosis not present

## 2016-05-31 ENCOUNTER — Encounter (HOSPITAL_BASED_OUTPATIENT_CLINIC_OR_DEPARTMENT_OTHER): Payer: Self-pay | Admitting: Pulmonary Disease

## 2016-05-31 ENCOUNTER — Telehealth: Payer: Self-pay | Admitting: Pulmonary Disease

## 2016-05-31 DIAGNOSIS — G4710 Hypersomnia, unspecified: Secondary | ICD-10-CM | POA: Diagnosis not present

## 2016-05-31 DIAGNOSIS — G4761 Periodic limb movement disorder: Secondary | ICD-10-CM | POA: Insufficient documentation

## 2016-05-31 DIAGNOSIS — G4733 Obstructive sleep apnea (adult) (pediatric): Secondary | ICD-10-CM | POA: Insufficient documentation

## 2016-05-31 HISTORY — DX: Obstructive sleep apnea (adult) (pediatric): G47.33

## 2016-05-31 NOTE — Telephone Encounter (Signed)
PSG 05/20/16 >> AHI 12.7, SpO2 low 79%.  PLMI 30.16.   Will have my nurse inform pt that sleep study showed mild obstructive sleep apnea.  Please schedule ROV with me or nurse practitioner to review results and discuss treatment options.

## 2016-05-31 NOTE — Telephone Encounter (Signed)
Patient aware of sleep study results. Scheduled to see Dr. Halford Chessman tomorrow at 1130 Patient aware of appointment Nothing further needed.

## 2016-05-31 NOTE — Procedures (Signed)
   Patient Name: Jacob, Ryan Date: 05/20/2016 Gender: Male D.O.B: November 10, 1970 Age (years): 53 Referring Provider: [name] MD, ABSM Height (inches): 72 Interpreting Physician: [name] MD, ABSM Weight (lbs): 195 RPSGT: Earney [place] BMI: 26 MRN: [id number] Neck Size: 15.00  CLINICAL INFORMATION Sleep Study Type: NPSG Indication for sleep study: Snoring Epworth Sleepiness Score: 2  SLEEP STUDY TECHNIQUE As per the AASM Manual for the Scoring of Sleep and Associated Events v2.3 (April 2016) with a hypopnea requiring 4% desaturations. The channels recorded and monitored were frontal, central and occipital EEG, electrooculogram (EOG), submentalis EMG (chin), nasal and oral airflow, thoracic and abdominal wall motion, anterior tibialis EMG, snore microphone, electrocardiogram, and pulse oximetry.  MEDICATIONS Patient's medications include: AMBIEN 10, AMBIEN CR, BELSOMRA, GABAPENTIN. Medications self-administered by patient during sleep study : Sleep medicine administered - Ambien 10 mg at 08:30:45 PM  SLEEP ARCHITECTURE The study was initiated at 10:34:10 PM and ended at 4:55:58 AM. Sleep onset time was 7.2 minutes and the sleep efficiency was 95.4%. The total sleep time was 364.1 minutes. Stage REM latency was 89.0 minutes. The patient spent 1.10% of the night in stage N1 sleep, 81.19% in stage N2 sleep, 0.00% in stage N3 and 17.72% in REM. Alpha intrusion was absent. Supine sleep was 100.00%.  RESPIRATORY PARAMETERS The overall apnea/hypopnea index (AHI) was 12.7 per hour. There were 31 total apneas, including 11 obstructive, 20 central and 0 mixed apneas. There were 46 hypopneas and 24 RERAs. The AHI during Stage REM sleep was 39.1 per hour. AHI while supine was 12.7 per hour. The mean oxygen saturation was 92.98%. The minimum SpO2 during sleep was 79.00%. Loud snoring was noted during this study.  CARDIAC DATA The 2 lead EKG demonstrated sinus rhythm. The  mean heart rate was 57.08 beats per minute. Other EKG findings include: None.  LEG MOVEMENT DATA The total PLMS were 183 with a resulting PLMS index of 30.16. Associated arousal with leg movement index was 1.6 .  IMPRESSIONS - This study shows mild obstructive sleep apnea with an AHI of 12.7 and  SaO2 low of 79%.   - There was an increase in the periodic limb movement index.  DIAGNOSIS - Obstructive Sleep Apnea (327.23 [G47.33 ICD-10]) - Nocturnal Hypoxemia (327.26 [G47.36 ICD-10]) - Periodic Limb Movements of Sleep (327.51 [G47.61 ICD-10])  RECOMMENDATIONS - Additional therapies for obstructive sleep apnea include weight loss, CPAP, oral appliance, or surgical assessment. - He should be assessed for presence of restless leg syndrome.  [Electronically signed] 05/31/2016 10:37 AM  [name] MD, ABSM Diplomate, American Board of Sleep Medicine   NPI: [id number]

## 2016-06-01 ENCOUNTER — Encounter: Payer: Self-pay | Admitting: Pulmonary Disease

## 2016-06-01 ENCOUNTER — Ambulatory Visit (INDEPENDENT_AMBULATORY_CARE_PROVIDER_SITE_OTHER): Payer: BLUE CROSS/BLUE SHIELD | Admitting: Pulmonary Disease

## 2016-06-01 VITALS — BP 146/94 | HR 68 | Ht 72.0 in | Wt 193.6 lb

## 2016-06-01 DIAGNOSIS — G4733 Obstructive sleep apnea (adult) (pediatric): Secondary | ICD-10-CM | POA: Diagnosis not present

## 2016-06-01 NOTE — Progress Notes (Signed)
Current Outpatient Prescriptions on File Prior to Visit  Medication Sig  . clonazePAM (KLONOPIN) 0.5 MG tablet Take 0.5 mg by mouth 3 (three) times daily as needed.  . gabapentin (NEURONTIN) 300 MG capsule Take 1 capsule (300 mg total) by mouth 5 (five) times daily as needed. Take 2 capsules at 8pm and 3 capsules at bedtime.  . Glucosamine-Chondroitin 500-400 MG CAPS Frequency:   Dosage:0.0     Instructions:Glucosamine Chondr 1500 Complx ( CAPS, Oral )  Note:  . Multiple Vitamin (MULTI-VITAMINS) TABS Frequency:   Dosage:0.0     Instructions:Multi Vitamin Mens ( TABS, Oral )  Note:  . Suvorexant (BELSOMRA) 10 MG TABS Take 10 mg by mouth at bedtime.  Marland Kitchen zolpidem (AMBIEN CR) 12.5 MG CR tablet Take 1 tablet (12.5 mg total) by mouth at bedtime.  Marland Kitchen zolpidem (AMBIEN) 10 MG tablet Take 1 tablet (10 mg total) by mouth at bedtime as needed for sleep.  . metoprolol succinate (TOPROL-XL) 50 MG 24 hr tablet    No current facility-administered medications on file prior to visit.     Chief Complaint  Patient presents with  . Follow-up    Review sleep study    Tests PSG 05/15/12 >> AHI 3, SpO2 low 86%, PLMI 27.2, UARS PSG 05/20/16 >> AHI 12.7, SpO2 low 79%, PLMI 30.16  Past medical history HA, HTN, Chronic pain  Past surgical history, allergies, social history, family history reviewed.  Vital signs BP (!) 146/94 (BP Location: Left Arm, Cuff Size: Normal)   Pulse 68   Ht 6' (1.829 m)   Wt 193 lb 9.6 oz (87.8 kg)   SpO2 98%   BMI 26.26 kg/m    History of Present Illness: Jacob Ryan is a 45 y.o. male with sleep disruption from OSA, chronic pain, insomnia, restless legs.  He is here to review his sleep study.  This showed mild obstructive sleep apnea.   Physical Exam: General: pleasant HEENT: no sinus tenderness, MP 4, enlarged tongue with scalloped border, no LAN Cardiac: regular, no murmur Chest: no wheeze, rales Abd: soft, non tender Ext: no edema Neuro: CN intact, normal  strength Skin: no rashes  Assessment/Plan:  Insomnia. - reviewed sleep restriction, stimulus control and relaxation techniques - reviewed good sleep hygiene habits - continue ambien CR at 8 pm, belsomra/ambien 10 mg at 10pm until after review of his sleep study  Obstructive sleep apnea. - We discussed how sleep apnea can affect various health problems, including risks for hypertension, cardiovascular disease, and diabetes.  We also discussed how sleep disruption can increase risks for accidents, such as while driving.  Weight loss as a means of improving sleep apnea was also reviewed.  Additional treatment options discussed were CPAP therapy, oral appliance, and surgical intervention. - will arrange for auto CPAP set up  Anxiety. - discussed option of referral behavioral health >> he would like to defer this for now  Chronic neck/back pain. Memory loss. - f/u with neurology   Patient Instructions  Will arrange for auto CPAP set up  Can look up following company web sites for CPAP mask options: [employer], [employer], [employer], [employer]  Follow up in 3 months   [name], MD [facility]/Sleep Pager:  [phone] 06/01/2016, 11:45 AM

## 2016-06-01 NOTE — Patient Instructions (Signed)
Will arrange for auto CPAP set up  Can look up following company web sites for CPAP mask options: [employer], [employer], [employer], [employer]  Follow up in 3 months

## 2016-06-20 ENCOUNTER — Telehealth: Payer: Self-pay | Admitting: Pulmonary Disease

## 2016-06-20 NOTE — Telephone Encounter (Signed)
Called spoke with pt. He reports he has been feeling weird for the past 1 week or so. C/o feeling sick to stomach, light headed, dizzy. He read am article on the ambien and read about some of these can be side effects. He reports he has been on [place] for a while as well but has noticed since that his symptoms gets worse. He would like Dr. Juanetta Gosling opinion. Please advise thanks

## 2016-06-20 NOTE — Telephone Encounter (Signed)
He has been on [place] for a long time.  I don't think his symptoms are related to [place].  I would also be worried about him having severe rebound insomnia if he suddenly stops [place].  If his symptoms persist, then he needs to come in for an ROV.

## 2016-06-20 NOTE — Telephone Encounter (Signed)
Called spoke with pt. He is aware of VS response below. He prefers to schedule an appt. He is now scheduled to see TP. Nothing further needed

## 2016-06-22 ENCOUNTER — Ambulatory Visit (INDEPENDENT_AMBULATORY_CARE_PROVIDER_SITE_OTHER): Payer: BLUE CROSS/BLUE SHIELD | Admitting: Adult Health

## 2016-06-22 ENCOUNTER — Encounter: Payer: Self-pay | Admitting: Adult Health

## 2016-06-22 DIAGNOSIS — R42 Dizziness and giddiness: Secondary | ICD-10-CM | POA: Diagnosis not present

## 2016-06-22 DIAGNOSIS — G4733 Obstructive sleep apnea (adult) (pediatric): Secondary | ICD-10-CM

## 2016-06-22 MED ORDER — MECLIZINE HCL 25 MG PO TABS: 25.0000 mg | ORAL_TABLET | Freq: Three times a day (TID) | ORAL | 0 refills | Status: DC | PRN

## 2016-06-22 NOTE — Patient Instructions (Signed)
Use meclizine 25mg  1/2 -1 Three times a day  As needed  Dizziness.  Push fluids .  Change positions slowly.  Follow up with Primary MD for dizziness.  Please contact office for sooner follow up if symptoms do not improve or worsen or seek emergency care  Follow up in Nov for sleep apnea as planned Continue on CPAP At bedtime .

## 2016-06-22 NOTE — Progress Notes (Signed)
Subjective:    Patient ID: Jacob Ryan, male    DOB: 10-12-1971, 45 y.o.   MRN: [id number]  HPI 45 yo male followed for Mild OSA   TEST  PSG 05/15/12 >> AHI 3, SpO2 low 86%, PLMI 27.2, UARS PSG 05/20/16 >> AHI 12.7, SpO2 low 79%, PLMI 30.16  06/22/2016 Follow up: OSA  Pt presents for a work in visit . He is followed for mild OSA recently started on CPAP .  Complains over last 10 months he has intermittent lightheadedness , upset stomach . Feels he gets overheated at times even when he is not outside. Sometimes it is mild and sometimes it gets quite severe. Has to sit down and wait for it to go away. No associated chest pain, palpitations, visual/speech changes, ext weakness, or syncope. He feels he is dry but when he drinks Water makes his stomach more upset. Soda seems to help calm his stomach. Worse with changing position. And turning head fast.  He looked up on the Internet that Ambien could cause this. He has been on [place] for years.   He has chronic neck pain, takes Gabapentin 900mg  At bedtime  .  Has chronic severe insomnia takes sleeping meds for years.  He says if he does not take sleep meds he will stay awake for days.  Takes ambien CR at 8pm, belsomra /ambien 10mg  at 10pm.   Recent sleep study showed mild OSA, he started on CPAP last week.  Says he is wearing . Not his favorite thing but is giving it a good try.   Seen by PCP couple of time , labs and EKG  done . Told everything was okay .      Review of Systems Constitutional:   No  weight loss, night sweats,  Fevers, chills, fatigue, or  lassitude.  HEENT:   No headaches,  Difficulty swallowing,  Tooth/dental problems, or  Sore throat,                No sneezing, itching, ear ache, nasal congestion, post nasal drip,   CV:  No chest pain,  Orthopnea, PND, swelling in lower extremities, anasarca, dizziness, palpitations, syncope.   GI  No heartburn, indigestion, abdominal pain, +nausea, no vomiting, diarrhea, change  in bowel habits, loss of appetite, bloody stools.   Resp: No shortness of breath with exertion or at rest.  No excess mucus, no productive cough,  No non-productive cough,  No coughing up of blood.  No change in color of mucus.  No wheezing.  No chest wall deformity  Skin: no rash or lesions.  GU: no dysuria, change in color of urine, no urgency or frequency.  No flank pain, no hematuria   MS:  No joint pain or swelling.  No decreased range of motion.  No back pain.  Psych:  No change in mood or affect. No depression or anxiety.  No memory loss.         Objective:   Physical Exam Vitals:   06/22/16 1613  BP: (!) 142/86  Pulse: (!) 52  SpO2: 98%  Weight: 194 lb (88 kg)  Height: 6' (1.829 m)   GEN: A/Ox3; pleasant , NAD, well nourished    HEENT:  [place]/AT,  EACs-clear, TMs-wnl, NOSE-clear, THROAT-clear, no lesions, no postnasal drip or exudate noted.   NECK:  Supple w/ fair ROM; no JVD; normal carotid impulses w/o bruits; no thyromegaly or nodules palpated; no lymphadenopathy.    RESP  Clear  P &  A; w/o, wheezes/ rales/ or rhonchi. no accessory muscle use, no dullness to percussion  CARD:  RRR, no m/r/g  , no peripheral edema, pulses intact, no cyanosis or clubbing.  GI:   Soft & nt; nml bowel sounds; no organomegaly or masses detected.   Musco: Warm bil, no deformities or joint swelling noted.   Neuro: alert, no focal deficits noted.  CN2-12 intact, MAEWx 4 . nml grips, neg pronator drift, nml gait.   Skin: Warm, no lesions or rashes    Young Mulvey NP-C  [facility]  06/22/16

## 2016-06-23 ENCOUNTER — Encounter: Payer: Self-pay | Admitting: Adult Health

## 2016-06-23 DIAGNOSIS — R42 Dizziness and giddiness: Secondary | ICD-10-CM | POA: Insufficient documentation

## 2016-06-23 NOTE — Assessment & Plan Note (Signed)
Dizziness ? Etiology , possible vertigo  It is very possible this is side effects from his meds however has been on these for years.  Says he can not make it without sleep meds.  Will try meclizine to see if this helps along with fluids  Advised that if not improving needs further evaluation and follow up with PCP .   Plan  Patient Instructions  Use meclizine 25mg  1/2 -1 Three times a day  As needed  Dizziness.  Push fluids .  Change positions slowly.  Follow up with Primary MD for dizziness.  Please contact office for sooner follow up if symptoms do not improve or worsen or seek emergency care  Follow up in Nov for sleep apnea as planned Continue on CPAP At bedtime .

## 2016-06-23 NOTE — Progress Notes (Signed)
Reviewed and agree with assessment/plan.  [name], MD [facility] Pulmonary/Critical Care 06/23/2016, 10:22 AM Pager:  [phone]

## 2016-06-23 NOTE — Assessment & Plan Note (Signed)
Recent dx Mild OSA , continue with CPAP .  follow up with download as planned

## 2016-07-06 ENCOUNTER — Ambulatory Visit: Payer: BLUE CROSS/BLUE SHIELD | Admitting: Pulmonary Disease

## 2016-07-27 ENCOUNTER — Other Ambulatory Visit: Payer: Self-pay | Admitting: Neurology

## 2016-07-27 ENCOUNTER — Other Ambulatory Visit: Payer: Self-pay | Admitting: Pulmonary Disease

## 2016-07-28 ENCOUNTER — Telehealth: Payer: Self-pay | Admitting: Pulmonary Disease

## 2016-07-28 NOTE — Telephone Encounter (Signed)
Spoke with pt. Reviewed MW's recs. I explained to him that I would send the message to VS to address at his return to office. He voiced understanding and had no further questions.   VS please advise

## 2016-07-28 NOTE — Telephone Encounter (Signed)
Pt requesting refills on belsomra, ambien cx12.5 & ambien 10mg  as he is completely out of these medication. Pt was scheduled for f/u on 07-06-16 that was canceled until 08-31-16.  VS please advise. Thanks.

## 2016-07-28 NOTE — Telephone Encounter (Signed)
MW please advise as VS is post call. Thanks

## 2016-07-28 NOTE — Telephone Encounter (Signed)
No can do - already on multiple psychotropics than have benzo properties  icluding klonopin so rec take an extra dose of this at hs until Dr Halford Chessman can review appropriateness of 3 others

## 2016-07-30 NOTE — Telephone Encounter (Signed)
Okay to send refills on all of these.

## 2016-07-31 MED ORDER — ZOLPIDEM TARTRATE 10 MG PO TABS: 10.0000 mg | ORAL_TABLET | Freq: Every evening | ORAL | 1 refills | Status: DC | PRN

## 2016-07-31 MED ORDER — SUVOREXANT 10 MG PO TABS: 10.0000 mg | ORAL_TABLET | Freq: Every day | ORAL | 1 refills | Status: DC

## 2016-07-31 MED ORDER — ZOLPIDEM TARTRATE ER 12.5 MG PO TBCR: 12.5000 mg | EXTENDED_RELEASE_TABLET | Freq: Every day | ORAL | 1 refills | Status: DC

## 2016-07-31 NOTE — Telephone Encounter (Signed)
Called and spoke with pt and he is aware of refills that have been sent to the pharmacy. Nothing further is needed.  

## 2016-08-17 ENCOUNTER — Telehealth: Payer: Self-pay | Admitting: Pulmonary Disease

## 2016-08-17 NOTE — Telephone Encounter (Signed)
Opened in error.  Close message.

## 2016-08-18 ENCOUNTER — Other Ambulatory Visit: Payer: Self-pay | Admitting: Neurology

## 2016-08-31 ENCOUNTER — Encounter: Payer: Self-pay | Admitting: Pulmonary Disease

## 2016-08-31 ENCOUNTER — Ambulatory Visit (INDEPENDENT_AMBULATORY_CARE_PROVIDER_SITE_OTHER): Payer: BLUE CROSS/BLUE SHIELD | Admitting: Pulmonary Disease

## 2016-08-31 VITALS — BP 122/82 | HR 51 | Ht 72.0 in | Wt 205.2 lb

## 2016-08-31 DIAGNOSIS — G4700 Insomnia, unspecified: Secondary | ICD-10-CM

## 2016-08-31 DIAGNOSIS — G4733 Obstructive sleep apnea (adult) (pediatric): Secondary | ICD-10-CM

## 2016-08-31 DIAGNOSIS — G894 Chronic pain syndrome: Secondary | ICD-10-CM | POA: Diagnosis not present

## 2016-08-31 MED ORDER — ZOLPIDEM TARTRATE 10 MG PO TABS: 10.0000 mg | ORAL_TABLET | Freq: Every evening | ORAL | 5 refills | Status: DC | PRN

## 2016-08-31 MED ORDER — SUVOREXANT 10 MG PO TABS: 10.0000 mg | ORAL_TABLET | Freq: Every day | ORAL | 5 refills | Status: DC

## 2016-08-31 MED ORDER — ZOLPIDEM TARTRATE ER 12.5 MG PO TBCR: 12.5000 mg | EXTENDED_RELEASE_TABLET | Freq: Every day | ORAL | 5 refills | Status: DC

## 2016-08-31 MED ORDER — GABAPENTIN 300 MG PO CAPS: ORAL_CAPSULE | ORAL | 5 refills | Status: DC

## 2016-08-31 NOTE — Progress Notes (Signed)
Current Outpatient Prescriptions on File Prior to Visit  Medication Sig  . Glucosamine-Chondroitin 500-400 MG CAPS Frequency:   Dosage:0.0     Instructions:Glucosamine Chondr 1500 Complx ( CAPS, Oral )  Note:  . metoprolol succinate (TOPROL-XL) 50 MG 24 hr tablet   . Multiple Vitamin (MULTI-VITAMINS) TABS Frequency:   Dosage:0.0     Instructions:Multi Vitamin Mens ( TABS, Oral )  Note:   No current facility-administered medications on file prior to visit.     Chief Complaint  Patient presents with  . Follow-up    Pt states that he is having a hard time sleeping with CPAP- wakes up and mask is leaking and will not stay positioned. Pt wears nightly. DME: [facility]    Sleep tests PSG 05/15/12 >> AHI 3, SpO2 low 86%, PLMI 27.2, UARS PSG 05/20/16 >> AHI 12.7, SpO2 low 79%, PLMI 30.16 Auto CPAP 07/31/16 to 08/29/16 >> used on 22 to 30 nights with average 6 hrs 32 min.  Average AHI 4.9 with median CPAP 7 and 95 th percentile CPAP 10 cm H2O  Past medical history HA, HTN, Chronic pain  Past surgical history, allergies, social history, family history reviewed.  Vital signs BP 122/82 (BP Location: Right Arm, Cuff Size: Normal)   Pulse (!) 51   Ht 6' (1.829 m)   Wt 205 lb 3.2 oz (93.1 kg)   SpO2 96%   BMI 27.83 kg/m    History of Present Illness: Jacob Ryan is a 45 y.o. male with sleep disruption from OSA, chronic pain, insomnia, restless legs.  He has been feeling better in the afternoon since starting CPAP.  He has nasal pillows mask.  Sometimes comes off and leaks.  He does feel that CPAP has helped.  He is also seeing a chiropractor and neck pain has improved.  He tried cutting down ambien immediate release, but had trouble waking up in the middle of the night.  Physical Exam: General: pleasant HEENT: no sinus tenderness, MP 4, enlarged tongue with scalloped border, no LAN Cardiac: regular, no murmur Chest: no wheeze, rales Abd: soft, non tender Ext: no edema Neuro: CN intact,  normal strength Skin: no rashes  Assessment/Plan:  Insomnia. - reviewed sleep restriction, stimulus control and relaxation techniques - reviewed good sleep hygiene habits - continue ambien CR/neurontin 600 mg at 8 pm, belsomra/ambien 10 mg/neurontin 300 mg at 10pm   Obstructive sleep apnea. - he is compliant with CPAP and reports benefit from therapy - continue auto CPAP set up   Patient Instructions  Follow up in 6 months   [name], MD [place] [facility]/Critical Care/Sleep Pager:  [phone] 08/31/2016, 10:13 AM

## 2016-08-31 NOTE — Patient Instructions (Signed)
Follow up in 6 months 

## 2017-02-27 ENCOUNTER — Ambulatory Visit (INDEPENDENT_AMBULATORY_CARE_PROVIDER_SITE_OTHER): Payer: BLUE CROSS/BLUE SHIELD | Admitting: Pulmonary Disease

## 2017-02-27 ENCOUNTER — Encounter: Payer: Self-pay | Admitting: Pulmonary Disease

## 2017-02-27 ENCOUNTER — Telehealth: Payer: Self-pay | Admitting: Pulmonary Disease

## 2017-02-27 VITALS — BP 102/78 | HR 64 | Ht 72.0 in | Wt 210.6 lb

## 2017-02-27 DIAGNOSIS — G4733 Obstructive sleep apnea (adult) (pediatric): Secondary | ICD-10-CM

## 2017-02-27 DIAGNOSIS — Z9989 Dependence on other enabling machines and devices: Secondary | ICD-10-CM | POA: Diagnosis not present

## 2017-02-27 MED ORDER — ZOLPIDEM TARTRATE 10 MG PO TABS: 10.0000 mg | ORAL_TABLET | Freq: Every evening | ORAL | 5 refills | Status: DC | PRN

## 2017-02-27 MED ORDER — ZOLPIDEM TARTRATE ER 12.5 MG PO TBCR: 12.5000 mg | EXTENDED_RELEASE_TABLET | Freq: Every day | ORAL | 5 refills | Status: DC

## 2017-02-27 MED ORDER — GABAPENTIN 300 MG PO CAPS: ORAL_CAPSULE | ORAL | 5 refills | Status: DC

## 2017-02-27 MED ORDER — SUVOREXANT 10 MG PO TABS: 10.0000 mg | ORAL_TABLET | Freq: Every day | ORAL | 5 refills | Status: DC

## 2017-02-27 NOTE — Progress Notes (Signed)
Current Outpatient Prescriptions on File Prior to Visit  Medication Sig  . Glucosamine-Chondroitin 500-400 MG CAPS Frequency:   Dosage:0.0     Instructions:Glucosamine Chondr 1500 Complx ( CAPS, Oral )  Note:  . Multiple Vitamin (MULTI-VITAMINS) TABS Frequency:   Dosage:0.0     Instructions:Multi Vitamin Mens ( TABS, Oral )  Note:   No current facility-administered medications on file prior to visit.     Chief Complaint  Patient presents with  . Follow-up    Pt c/o dificulty breathing with CPAP recently. Pt states that he is gasping for air and is unable to go to sleep - suffocating feeling. DME: [facility]    Sleep tests PSG 05/15/12 >> AHI 3, SpO2 low 86%, PLMI 27.2, UARS PSG 05/20/16 >> AHI 12.7, SpO2 low 79%, PLMI 30.16 Auto CPAP 01/27/17 to 02/25/17 >> used on 17 of 30 nights with average 4 hrs 32 min.  Average AHI 3.5 with median CPAP 8 and 95 th percentile CPAP 11 cm H2O  Past medical history HA, HTN, Chronic pain  Past surgical history, allergies, social history, family history reviewed.  Vital signs BP 102/78 (BP Location: Left Arm, Cuff Size: Normal)   Pulse 64   Ht 6' (1.829 m)   Wt 210 lb 9.6 oz (95.5 kg)   SpO2 97%   BMI 28.56 kg/m    History of Present Illness: Jacob Ryan is a 46 y.o. male with sleep disruption from OSA, chronic pain, insomnia, restless legs.  He has noticed more trouble with his sleep.  He doesn't think he is sleeping.  His girlfriend says he is.  He doesn't understand why his friends can sleep for few hours and feel so good during the day, and he doesn't feel like he is like this.    He is sticking with his sleep pills plan at night.  He gets frustrated that his evening is so focused on his pills and sleep.   He doesn't feel like he has trouble with alertness once he gets up in the morning, and is at work.  He doesn't feel like CPAP is working as well as before.  He uses on regular basis, but feels that there are times when he is not getting enough  pressure >> usually as he is falling to sleep.  Once he is asleep, then he can sleep for several hours with CPAP on.  Physical Exam:  General - pleasant Eyes - pupils reactive ENT - no sinus tenderness, no oral exudate, no LAN, MP 4, enlarged tongue Cardiac - regular, no murmur Chest - no wheeze, rales Abd - soft, non tender Ext - no edema Skin - no rashes Neuro - normal strength Psych - normal mood   Assessment/Plan:  Insomnia. - reviewed sleep restriction, stimulus control, and relaxation techniques - discussed setting realistic expectations from sleep - continue ambien CR/neurontin 600 mg at 8 pm, belsomra/ambien 10 mg/neurontin 300 mg at 10 pm  Obstructive sleep apnea. - he is compliant with CPAP and reports benefit from therapy - will change his auto CPAP to range of 8 to 15 cm H2O - if he still has trouble, then will need to do in lab titration   Patient Instructions  Will have your auto CPAP change to range of 8 to 15 cm H2O  Send me an email if you are still having trouble   Follow up in 6 months  Time spent 28 minutes  Jacob Mires, MD [place] [facility]monary/Critical Care/Sleep Pager:  [phone] 02/27/2017,  10:13 AM

## 2017-02-27 NOTE — Patient Instructions (Signed)
Will have your auto CPAP change to range of 8 to 15 cm H2O  Send me an email if you are still having trouble   Follow up in 6 months

## 2017-02-27 NOTE — Telephone Encounter (Signed)
A prescription refill fax was sent to [facility] at [phone] for suvorexant, zolpidem, and zolpidem CR. Pharmacy was contacted and Marjory Lies stated fax was received. Nothing further is needed.

## 2017-03-23 ENCOUNTER — Telehealth: Payer: Self-pay | Admitting: Pulmonary Disease

## 2017-03-23 NOTE — Telephone Encounter (Signed)
Called and spoke with the pharmacy and they are aware of these meds that were given to the pt by VS.  Nothing further is needed.

## 2017-05-24 ENCOUNTER — Other Ambulatory Visit: Payer: Self-pay | Admitting: Pulmonary Disease

## 2017-08-31 ENCOUNTER — Encounter: Payer: Self-pay | Admitting: Pulmonary Disease

## 2017-08-31 ENCOUNTER — Ambulatory Visit: Payer: BLUE CROSS/BLUE SHIELD | Admitting: Pulmonary Disease

## 2017-08-31 VITALS — BP 110/68 | HR 66 | Ht 72.0 in | Wt 203.8 lb

## 2017-08-31 DIAGNOSIS — G4700 Insomnia, unspecified: Secondary | ICD-10-CM | POA: Diagnosis not present

## 2017-08-31 MED ORDER — ZOLPIDEM TARTRATE 10 MG PO TABS: 10.0000 mg | ORAL_TABLET | Freq: Every evening | ORAL | 5 refills | Status: DC | PRN

## 2017-08-31 MED ORDER — ZOLPIDEM TARTRATE ER 12.5 MG PO TBCR: 12.5000 mg | EXTENDED_RELEASE_TABLET | Freq: Every day | ORAL | 5 refills | Status: DC

## 2017-08-31 MED ORDER — SUVOREXANT 10 MG PO TABS: 10.0000 mg | ORAL_TABLET | Freq: Every day | ORAL | 5 refills | Status: DC

## 2017-08-31 NOTE — Patient Instructions (Signed)
Follow up in 8 months

## 2017-08-31 NOTE — Progress Notes (Signed)
Current Outpatient Medications on File Prior to Visit  Medication Sig  . fenofibrate (TRICOR) 145 MG tablet Take 145 mg daily by mouth.  . gabapentin (NEURONTIN) 300 MG capsule Take 600mg  at 8pm and 300mg  at 10pm  . Glucosamine-Chondroitin 500-400 MG CAPS Frequency:   Dosage:0.0     Instructions:Glucosamine Chondr 1500 Complx ( CAPS, Oral )  Note:  . lisinopril (PRINIVIL,ZESTRIL) 20 MG tablet Take 40 mg by mouth daily.  . Multiple Vitamin (MULTI-VITAMINS) TABS Frequency:   Dosage:0.0     Instructions:Multi Vitamin Mens ( TABS, Oral )  Note:   No current facility-administered medications on file prior to visit.     Chief Complaint  Patient presents with  . Follow-up    Pt has not used CPAP machine since May 2018, he states the noise of the machine woke him up each night and didn't help him any better. Pt sleeps 5-6 hours each night, with some daytime sleepiness.    Sleep tests PSG 05/15/12 >> AHI 3, SpO2 low 86%, PLMI 27.2, UARS PSG 05/20/16 >> AHI 12.7, SpO2 low 79%, PLMI 30.16 Auto CPAP 01/27/17 to 02/25/17 >> used on 17 of 30 nights with average 4 hrs 32 min.  Average AHI 3.5 with median CPAP 8 and 95 th percentile CPAP 11 cm H2O  Past medical history HA, HTN, Chronic pain  Past surgical history, allergies, social history, family history reviewed.  Vital signs BP 110/68 (BP Location: Left Arm, Cuff Size: Normal)   Pulse 66   Ht 6' (1.829 m)   Wt 203 lb 12.8 oz (92.4 kg)   SpO2 96%   BMI 27.64 kg/m    History of Present Illness: Jacob Ryan is a 46 y.o. male with sleep disruption from OSA, chronic pain, insomnia, restless legs.  He stopped using CPAP.  Made too much noise.  He feels sleep is better w/o CPAP.  He feels his stress level is much better.  He is feeling much better about his work, and things are looking up with new projects.  He eats dinner around 6 or 630 pm.  Takes first round of medicines at 8 pm.  Takes second round of medicines at 10 pm.  He is now able to  get about 5 to 7 hours of sleep on a fairly consisted basis.  He has some mild daytime sleepiness, but not like before.  Physical Exam:  General - pleasant Eyes - pupils reactive ENT - no sinus tenderness, no oral exudate, no LAN Cardiac - regular, no murmur Chest - no wheeze, rales Abd - soft, non tender Ext - no edema Skin - no rashes Neuro - normal strength Psych - normal mood   Assessment/Plan:  Insomnia. - continue ambien CR/neurontin 600 mg at 8 pm, belsomra/ambien 10 mg/neurontin 300 mg at 10 pm - if he feels his sleep continues to improve, then he can try reducing the 10 pm dose of ambien to 5 mg - explained that he would have to go very slow with any attempts at reducing his sleep aide medications  Obstructive sleep apnea. - he was not able to tolerate CPAP - will monitor how he does off CPAP   Patient Instructions  Follow up in 8 months    Jacob Mires, MD [place] [facility]/Critical Care/Sleep Pager:  [phone] 08/31/2017, 9:31 AM

## 2017-09-11 ENCOUNTER — Telehealth: Payer: Self-pay | Admitting: Pulmonary Disease

## 2017-09-11 MED ORDER — GABAPENTIN 300 MG PO CAPS: ORAL_CAPSULE | ORAL | 5 refills | Status: DC

## 2017-09-11 NOTE — Telephone Encounter (Signed)
[facility]'d [facility] gab[facility] 300mg  refill. Completed this request today

## 2018-03-10 ENCOUNTER — Encounter: Payer: Self-pay | Admitting: Pulmonary Disease

## 2018-03-11 MED ORDER — ZOLPIDEM TARTRATE 10 MG PO TABS: 10.0000 mg | ORAL_TABLET | Freq: Every evening | ORAL | 5 refills | Status: DC | PRN

## 2018-03-11 MED ORDER — ZOLPIDEM TARTRATE ER 12.5 MG PO TBCR: 12.5000 mg | EXTENDED_RELEASE_TABLET | Freq: Every day | ORAL | 5 refills | Status: DC

## 2018-03-11 NOTE — Telephone Encounter (Signed)
Okay to send refills for ambien CR 12.5 mg nightly, and ambien 10 mg nightly.

## 2018-03-11 NOTE — Telephone Encounter (Signed)
Attempted to contact pt to see if I could schedule his f/u appt with VS. Pt last saw VS in November and was told to follow up 8 months from that visit, which would be July 2019.  Schedule has opened up and tried to contact pt to see if I could schedule that visit.  Left a message for pt to return call so that we could schedule his f/u appt.  Received mychart message from pt requesting a refill of ambien cr and the ambien 10mg .    Dr. Halford Chessman, please advise if you are fine with [place] refilling pt's Ambien CR 12.5mg  as well as the Ambien 10mg  tablet. Thanks!

## 2018-03-11 NOTE — Telephone Encounter (Signed)
Called pt's pharmacy, spoke with Lanny Hurst and gave verbal Rx information for both ambien CR and ambien 10mg .

## 2018-04-30 ENCOUNTER — Ambulatory Visit: Payer: BLUE CROSS/BLUE SHIELD | Admitting: Pulmonary Disease

## 2018-04-30 ENCOUNTER — Encounter: Payer: Self-pay | Admitting: Pulmonary Disease

## 2018-04-30 VITALS — BP 110/76 | HR 53 | Ht 72.0 in | Wt 186.0 lb

## 2018-04-30 DIAGNOSIS — G4701 Insomnia due to medical condition: Secondary | ICD-10-CM | POA: Diagnosis not present

## 2018-04-30 DIAGNOSIS — G8929 Other chronic pain: Secondary | ICD-10-CM | POA: Diagnosis not present

## 2018-04-30 DIAGNOSIS — F5102 Adjustment insomnia: Secondary | ICD-10-CM | POA: Diagnosis not present

## 2018-04-30 MED ORDER — ZOLPIDEM TARTRATE 10 MG PO TABS: 10.0000 mg | ORAL_TABLET | Freq: Every evening | ORAL | 5 refills | Status: DC | PRN

## 2018-04-30 MED ORDER — ZOLPIDEM TARTRATE ER 12.5 MG PO TBCR: 12.5000 mg | EXTENDED_RELEASE_TABLET | Freq: Every day | ORAL | 5 refills | Status: DC

## 2018-04-30 MED ORDER — GABAPENTIN 300 MG PO CAPS: 600.0000 mg | ORAL_CAPSULE | Freq: Every day | ORAL | 5 refills | Status: DC

## 2018-04-30 NOTE — Progress Notes (Signed)
[facility] [facility], Critical [facility], and Sleep Medicine  Chief Complaint  Patient presents with  . Follow-up    Pt is not using cpap machine for months, pt is doing well using the sleep aids. Pt is sleeping better, having less daytime sleepiness.    Constitutional: BP 110/76 (BP Location: Left Arm, Cuff Size: Normal)   Pulse (!) 53   Ht 6' (1.829 m)   Wt 186 lb (84.4 kg)   SpO2 97%   BMI 25.23 kg/m   History of Present Illness: Jacob Ryan is a 47 y.o. 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  He has hx of OSA, but improved with weight loss and was intolerant of CPAP.  Since his last visit he had sinus surgery with Dr. Janace Hoard.  Still has sinus congestion and now has more nasal voice.  Hasn't had sinus infection recently, but winter time is usually when he gets these.  He hasn't been seen by an allergist, and isn't using any nose sprays at present.  He was told that he would need to see a plastic surgeon if he needed to have further work on his deviated septum.  He changed his diet.  With this he has lost 25 lbs since last year.  This has helped his sleep and snoring.  He also has been working with a Restaurant manager, fast food and this has significantly improved his neck pain.  He is not eating dinner at 9 pm.  He takes his medication at 11 pm, and then goes to sleep.  He is using 600 mg gabapentin and 12.5 ambien cr.  About 1 or 2 times per week he will use ambien 10 mg.   Comprehensive Respiratory Exam:  Appearance - well kempt  ENMT - nasal mucosa moist, turbinates clear, deviated nasal septum, no dental lesions, no gingival bleeding, no oral exudates, no tonsillar hypertrophy Neck - no masses, trachea midline, no thyromegaly, no elevation in JVP Respiratory - normal appearance of chest wall, normal respiratory effort w/o accessory muscle use, no wheezing or rales CV - s1s2 regular rate and rhythm, no murmurs, no peripheral edema, radial pulses symmetric GI - soft, non  tender Lymph - no adenopathy noted in neck and axillary areas MSK - normal muscle strength and tone, normal gait Ext - no cyanosis, clubbing, or joint inflammation noted Skin - no rashes, lesions, or ulcers Neuro - oriented to person, place, and time Psych - normal mood and affect   Assessment/Plan:  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 - continue ambien CR - he can try changing gabapentin to 300 mg at 11 pm - he can continue to use ambien 10 mg prn - explained how he can get hangover effect from some of his medications, and will monitor as he reduces his medications; if this persists, then trial of stimulant like armodafanil might be warranted  Nasal septal deviation. - he will think about whether he would want to see a plastic surgeon; explained that it is uncertain whether additional surgery would be covered by insurance   Patient Instructions  Can try changing gabapentin to 1 pill nightly Continue using ambien 12.5 mg nightly, and ambien 10 mg as needed  Follow up in 6 months    [name], MD [place] 04/30/2018, 10:00 AM  Flow Sheet  Sleep tests: PSG 05/15/12 >> AHI 3, SpO2 low 86%, PLMI 27.2, UARS PSG 05/20/16 >> AHI 12.7, SpO2 low 79%, PLMI 30.16 Auto CPAP 01/27/17 to 02/25/17 >> used on  17 of 30 nights with average 4 hrs 32 min.  Average AHI 3.5 with median CPAP 8 and 95 th percentile CPAP 11 cm H2O  Past Medical History: He  has a past medical history of Chronic headaches, Chronic neck and back pain, Hypertension, Insomnia, OSA (obstructive sleep apnea) (05/31/2016), and Vision abnormalities.  Past Surgical History: He  has no past surgical history on file.  Family History: His family history includes Healthy in his sister; Lymphoma in his mother; Parkinsonism in his father.  Social History: He  reports that he has never smoked. He has never used smokeless tobacco. He reports that he does not drink alcohol or use  drugs.  Medications: Allergies as of 04/30/2018      Reactions   Amoxicillin    Other    Feathers   Pollen Extract    Latex Rash      Medication List        Accurate as of 04/30/18 10:00 AM. Always use your most recent med list.          fenofibrate 145 MG tablet Commonly known as:  TRICOR Take 145 mg daily by mouth.   gabapentin 300 MG capsule Commonly known as:  NEURONTIN Take 2 capsules (600 mg total) by mouth at bedtime. 2 pills nightly   Glucosamine-Chondroitin 500-400 MG Caps Frequency:   Dosage:0.0     Instructions:Glucosamine Chondr 1500 Complx ( CAPS, Oral )  Note:   lisinopril 20 MG tablet Commonly known as:  PRINIVIL,ZESTRIL Take 40 mg by mouth daily.   MULTI-VITAMINS Tabs Frequency:   Dosage:0.0     Instructions:Multi Vitamin Mens ( TABS, Oral )  Note:   zolpidem 12.5 MG CR tablet Commonly known as:  AMBIEN CR Take 1 tablet (12.5 mg total) by mouth at bedtime.   zolpidem 10 MG tablet Commonly known as:  AMBIEN Take 1 tablet (10 mg total) by mouth at bedtime as needed for sleep.

## 2018-04-30 NOTE — Patient Instructions (Addendum)
Can try changing gabapentin to 1 pill nightly Continue using ambien 12.5 mg nightly, and ambien 10 mg as needed  Follow up in 6 months

## 2018-05-15 ENCOUNTER — Telehealth: Payer: Self-pay | Admitting: Pulmonary Disease

## 2018-05-15 NOTE — Telephone Encounter (Signed)
Call[facility] and [facility] [place] regarding rx for ambien 12.5mg  She states nothing is needed, issue has already been resolved. Nothing further needed at this time.

## 2018-10-10 ENCOUNTER — Encounter: Payer: Self-pay | Admitting: Nurse Practitioner

## 2018-10-10 ENCOUNTER — Ambulatory Visit: Payer: BLUE CROSS/BLUE SHIELD | Admitting: Nurse Practitioner

## 2018-10-10 VITALS — BP 130/70 | HR 55 | Ht 72.0 in | Wt 191.0 lb

## 2018-10-10 DIAGNOSIS — G4733 Obstructive sleep apnea (adult) (pediatric): Secondary | ICD-10-CM | POA: Diagnosis not present

## 2018-10-10 DIAGNOSIS — G4700 Insomnia, unspecified: Secondary | ICD-10-CM | POA: Diagnosis not present

## 2018-10-10 NOTE — Assessment & Plan Note (Signed)
Continue current medications Maintain healthy weight Do not drive if drowsy

## 2018-10-10 NOTE — Progress Notes (Signed)
Reviewed and agree with assessment/plan.   Fahima Cifelli, MD [place] [facility]/Critical Care 10/18/2016, 12:24 PM Pager:  [phone]  

## 2018-10-10 NOTE — Patient Instructions (Signed)
Continue Gabapentin  Continue Ambien Continue to work on healthy weight Do not drive if drowsy Please follow up with Dr. Halford Chessman in 6 months or sooner if needed  Insomnia Insomnia is a sleep disorder that makes it difficult to fall asleep or stay asleep. Insomnia can cause fatigue, low energy, difficulty concentrating, mood swings, and poor performance at work or school. There are three different ways to classify insomnia:  Difficulty falling asleep.  Difficulty staying asleep.  Waking up too early in the morning. Any type of insomnia can be long-term (chronic) or short-term (acute). Both are common. Short-term insomnia usually lasts for three months or less. Chronic insomnia occurs at least three times a week for longer than three months. What are the causes? Insomnia may be caused by another condition, situation, or substance, such as:  Anxiety.  Certain medicines.  Gastroesophageal reflux disease (GERD) or other gastrointestinal conditions.  Asthma or other breathing conditions.  Restless legs syndrome, sleep apnea, or other sleep disorders.  Chronic pain.  Menopause.  Stroke.  Abuse of alcohol, tobacco, or illegal drugs.  Mental health conditions, such as depression.  Caffeine.  Neurological disorders, such as Alzheimer's disease.  An overactive thyroid (hyperthyroidism). Sometimes, the cause of insomnia may not be known. What increases the risk? Risk factors for insomnia include:  Gender. Women are affected more often than men.  Age. Insomnia is more common as you get older.  Stress.  Lack of exercise.  Irregular work schedule or working night shifts.  Traveling between different time zones.  Certain medical and mental health conditions. What are the signs or symptoms? If you have insomnia, the main symptom is having trouble falling asleep or having trouble staying asleep. This may lead to other symptoms, such as:  Feeling fatigued or having low  energy.  Feeling nervous about going to sleep.  Not feeling rested in the morning.  Having trouble concentrating.  Feeling irritable, anxious, or depressed. How is this diagnosed? This condition may be diagnosed based on:  Your symptoms and medical history. Your health care provider may ask about: ? Your sleep habits. ? Any medical conditions you have. ? Your mental health.  A physical exam. How is this treated? Treatment for insomnia depends on the cause. Treatment may focus on treating an underlying condition that is causing insomnia. Treatment may also include:  Medicines to help you sleep.  Counseling or therapy.  Lifestyle adjustments to help you sleep better. Follow these instructions at home: Eating and drinking   Limit or avoid alcohol, caffeinated beverages, and cigarettes, especially close to bedtime. These can disrupt your sleep.  Do not eat a large meal or eat spicy foods right before bedtime. This can lead to digestive discomfort that can make it hard for you to sleep. Sleep habits   Keep a sleep diary to help you and your health care provider figure out what could be causing your insomnia. Write down: ? When you sleep. ? When you wake up during the night. ? How well you sleep. ? How rested you feel the next day. ? Any side effects of medicines you are taking. ? What you eat and drink.  Make your bedroom a dark, comfortable place where it is easy to fall asleep. ? Put up shades or blackout curtains to block light from outside. ? Use a white noise machine to block noise. ? Keep the temperature cool.  Limit screen use before bedtime. This includes: ? Watching TV. ? Using your smartphone, tablet, or  computer.  Stick to a routine that includes going to bed and waking up at the same times every day and night. This can help you fall asleep faster. Consider making a quiet activity, such as reading, part of your nighttime routine.  Try to avoid taking naps  during the day so that you sleep better at night.  Get out of bed if you are still awake after 15 minutes of trying to sleep. Keep the lights down, but try reading or doing a quiet activity. When you feel sleepy, go back to bed. General instructions  Take over-the-counter and prescription medicines only as told by your health care provider.  Exercise regularly, as told by your health care provider. Avoid exercise starting several hours before bedtime.  Use relaxation techniques to manage stress. Ask your health care provider to suggest some techniques that may work well for you. These may include: ? Breathing exercises. ? Routines to release muscle tension. ? Visualizing peaceful scenes.  Make sure that you drive carefully. Avoid driving if you feel very sleepy.  Keep all follow-up visits as told by your health care provider. This is important. Contact a health care provider if:  You are tired throughout the day.  You have trouble in your daily routine due to sleepiness.  You continue to have sleep problems, or your sleep problems get worse. Get help right away if:  You have serious thoughts about hurting yourself or someone else. If you ever feel like you may hurt yourself or others, or have thoughts about taking your own life, get help right away. You can go to your nearest emergency department or call:  Your local emergency services (911 in the [place]  A suicide crisis helpline, such as the [facility] at (707)078-6032. This is open 24 hours a day. Summary  Insomnia is a sleep disorder that makes it difficult to fall asleep or stay asleep.  Insomnia can be long-term (chronic) or short-term (acute).  Treatment for insomnia depends on the cause. Treatment may focus on treating an underlying condition that is causing insomnia.  Keep a sleep diary to help you and your health care provider figure out what could be causing your insomnia. This  information is not intended to replace advice given to you by your health care provider. Make sure you discuss any questions you have with your health care provider. Document Released: 10/06/2000 Document Revised: 07/19/2017 Document Reviewed: 07/19/2017 Elsevier Interactive Patient Education  2019 [employer].

## 2018-10-10 NOTE — Progress Notes (Signed)
@Patient  ID: Jacob Ryan, male    DOB: 23-Jun-1971, 47 y.o.   MRN: [id number]  Chief Complaint  Patient presents with  . Follow-up    Referring provider: Alvester Chou, NP   HPI 47 year old male never smoker with insomnia from psychological stress and chronic pain and OSA followed by Dr. Halford Chessman.   Tests:  Sleep tests: PSG 05/15/12 >> AHI 3, SpO2 low 86%, PLMI 27.2, UARS PSG 05/20/16 >> AHI 12.7, SpO2 low 79%, PLMI 30.16 Auto CPAP 01/27/17 to 02/25/17 >>used on 17 of 30 nights with average 4 hrs 32 min. Average AHI 3.5 with median CPAP 8 and 95 th percentile CPAP 11 cm H2O  OV 10/10/18 - follow up insomnia Patient presents for follow-up for insomnia and OSA today.  States that this is been stable interval for him.  He is compliant with Neurontin and Ambien.  He is prescribed an extra 10 mg Ambien that he can take at bedtime as needed.  He states that he only needs to take it maybe once a week.  He does not use CPAP.  He has been dieting and has lost around 25 pounds.  This is helped his sleep and snoring.  He has also modified his diet and eating patterns to help with his insomnia.  He denies any shortness of breath, chest pain, or edema.   Allergies  Allergen Reactions  . Amoxicillin   . Other     Feathers  . Pollen Extract   . Latex Rash    Immunization History  Administered Date(s) Administered  . Influenza Split 09/22/2013, 08/02/2017  . Influenza,inj,Quad PF,6+ Mos 07/06/2015, 08/24/2016, 09/06/2018    Past Medical History:  Diagnosis Date  . Chronic headaches   . Chronic neck and back pain   . Hypertension   . Insomnia   . OSA (obstructive sleep apnea) 05/31/2016  . Vision abnormalities     Tobacco History: Social History   Tobacco Use  Smoking Status Never Smoker  Smokeless Tobacco Never Used   Counseling given: Yes   Outpatient Encounter Medications as of 10/10/2018  Medication Sig  . clonazePAM (KLONOPIN) 0.5 MG tablet Take 1 tablet by mouth as  needed.  . fenofibrate (TRICOR) 145 MG tablet Take 145 mg daily by mouth.  . gabapentin (NEURONTIN) 300 MG capsule Take 2 capsules (600 mg total) by mouth at bedtime. 2 pills nightly  . Glucosamine-Chondroitin 500-400 MG CAPS Frequency:   Dosage:0.0     Instructions:Glucosamine Chondr 1500 Complx ( CAPS, Oral )  Note:  . lisinopril (PRINIVIL,ZESTRIL) 20 MG tablet Take 40 mg by mouth daily.  . Multiple Vitamin (MULTI-VITAMINS) TABS Frequency:   Dosage:0.0     Instructions:Multi Vitamin Mens ( TABS, Oral )  Note:  . Omega-3 1000 MG CAPS Take 1 capsule by mouth daily.  Marland Kitchen zolpidem (AMBIEN CR) 12.5 MG CR tablet Take 1 tablet (12.5 mg total) by mouth at bedtime.  Marland Kitchen zolpidem (AMBIEN) 10 MG tablet Take 1 tablet (10 mg total) by mouth at bedtime as needed for sleep.   No facility-administered encounter medications on file as of 10/10/2018.       Review of Systems  Review of Systems  Constitutional: Negative.  Negative for chills and fever.  HENT: Negative.   Respiratory: Negative for cough, shortness of breath and wheezing.   Cardiovascular: Negative.  Negative for chest pain, palpitations and leg swelling.  Gastrointestinal: Negative.   Allergic/Immunologic: Negative.   Neurological: Negative.   Psychiatric/Behavioral: Negative.  Physical Exam  BP 130/70 (BP Location: Left Arm, Patient Position: Sitting, Cuff Size: Normal)   Pulse (!) 55   Ht 6' (1.829 m)   Wt 191 lb (86.6 kg)   SpO2 96%   BMI 25.90 kg/m   Wt Readings from Last 5 Encounters:  10/10/18 191 lb (86.6 kg)  04/30/18 186 lb (84.4 kg)  08/31/17 203 lb 12.8 oz (92.4 kg)  02/27/17 210 lb 9.6 oz (95.5 kg)  08/31/16 205 lb 3.2 oz (93.1 kg)     Physical Exam Vitals signs and nursing note reviewed.  Constitutional:      General: He is not in acute distress.    Appearance: He is well-developed.  Cardiovascular:     Rate and Rhythm: Normal rate and regular rhythm.  Pulmonary:     Effort: Pulmonary effort is  normal. No respiratory distress.     Breath sounds: Normal breath sounds. No wheezing.  Musculoskeletal:        General: No swelling.  Skin:    General: Skin is warm and dry.  Neurological:     Mental Status: He is alert and oriented to person, place, and time.        Assessment & Plan:   Insomnia Stable interval - continue current medications   Patient Instructions  Continue Gabapentin  Continue Ambien Continue to work on healthy weight Do not drive if drowsy Please follow up with Dr. Halford Chessman in 6 months or sooner if needed  Insomnia Insomnia is a sleep disorder that makes it difficult to fall asleep or stay asleep. Insomnia can cause fatigue, low energy, difficulty concentrating, mood swings, and poor performance at work or school. There are three different ways to classify insomnia:  Difficulty falling asleep.  Difficulty staying asleep.  Waking up too early in the morning. Any type of insomnia can be long-term (chronic) or short-term (acute). Both are common. Short-term insomnia usually lasts for three months or less. Chronic insomnia occurs at least three times a week for longer than three months. What are the causes? Insomnia may be caused by another condition, situation, or substance, such as:  Anxiety.  Certain medicines.  Gastroesophageal reflux disease (GERD) or other gastrointestinal conditions.  Asthma or other breathing conditions.  Restless legs syndrome, sleep apnea, or other sleep disorders.  Chronic pain.  Menopause.  Stroke.  Abuse of alcohol, tobacco, or illegal drugs.  Mental health conditions, such as depression.  Caffeine.  Neurological disorders, such as Alzheimer's disease.  An overactive thyroid (hyperthyroidism). Sometimes, the cause of insomnia may not be known. What increases the risk? Risk factors for insomnia include:  Gender. Women are affected more often than men.  Age. Insomnia is more common as you get  older.  Stress.  Lack of exercise.  Irregular work schedule or working night shifts.  Traveling between different time zones.  Certain medical and mental health conditions. What are the signs or symptoms? If you have insomnia, the main symptom is having trouble falling asleep or having trouble staying asleep. This may lead to other symptoms, such as:  Feeling fatigued or having low energy.  Feeling nervous about going to sleep.  Not feeling rested in the morning.  Having trouble concentrating.  Feeling irritable, anxious, or depressed. How is this diagnosed? This condition may be diagnosed based on:  Your symptoms and medical history. Your health care provider may ask about: ? Your sleep habits. ? Any medical conditions you have. ? Your mental health.  A physical exam. How  is this treated? Treatment for insomnia depends on the cause. Treatment may focus on treating an underlying condition that is causing insomnia. Treatment may also include:  Medicines to help you sleep.  Counseling or therapy.  Lifestyle adjustments to help you sleep better. Follow these instructions at home: Eating and drinking   Limit or avoid alcohol, caffeinated beverages, and cigarettes, especially close to bedtime. These can disrupt your sleep.  Do not eat a large meal or eat spicy foods right before bedtime. This can lead to digestive discomfort that can make it hard for you to sleep. Sleep habits   Keep a sleep diary to help you and your health care provider figure out what could be causing your insomnia. Write down: ? When you sleep. ? When you wake up during the night. ? How well you sleep. ? How rested you feel the next day. ? Any side effects of medicines you are taking. ? What you eat and drink.  Make your bedroom a dark, comfortable place where it is easy to fall asleep. ? Put up shades or blackout curtains to block light from outside. ? Use a white noise machine to block  noise. ? Keep the temperature cool.  Limit screen use before bedtime. This includes: ? Watching TV. ? Using your smartphone, tablet, or computer.  Stick to a routine that includes going to bed and waking up at the same times every day and night. This can help you fall asleep faster. Consider making a quiet activity, such as reading, part of your nighttime routine.  Try to avoid taking naps during the day so that you sleep better at night.  Get out of bed if you are still awake after 15 minutes of trying to sleep. Keep the lights down, but try reading or doing a quiet activity. When you feel sleepy, go back to bed. General instructions  Take over-the-counter and prescription medicines only as told by your health care provider.  Exercise regularly, as told by your health care provider. Avoid exercise starting several hours before bedtime.  Use relaxation techniques to manage stress. Ask your health care provider to suggest some techniques that may work well for you. These may include: ? Breathing exercises. ? Routines to release muscle tension. ? Visualizing peaceful scenes.  Make sure that you drive carefully. Avoid driving if you feel very sleepy.  Keep all follow-up visits as told by your health care provider. This is important. Contact a health care provider if:  You are tired throughout the day.  You have trouble in your daily routine due to sleepiness.  You continue to have sleep problems, or your sleep problems get worse. Get help right away if:  You have serious thoughts about hurting yourself or someone else. If you ever feel like you may hurt yourself or others, or have thoughts about taking your own life, get help right away. You can go to your nearest emergency department or call:  Your local emergency services (911 in the [place]  A suicide crisis helpline, such as the [place] at 364 780 1068. This is open 24 hours a  day. Summary  Insomnia is a sleep disorder that makes it difficult to fall asleep or stay asleep.  Insomnia can be long-term (chronic) or short-term (acute).  Treatment for insomnia depends on the cause. Treatment may focus on treating an underlying condition that is causing insomnia.  Keep a sleep diary to help you and your health care provider figure out what could  be causing your insomnia. This information is not intended to replace advice given to you by your health care provider. Make sure you discuss any questions you have with your health care provider. Document Released: 10/06/2000 Document Revised: 07/19/2017 Document Reviewed: 07/19/2017 Elsevier Interactive Patient Education  2019 [employer]     OSA (obstructive sleep apnea) Continue current medications Maintain healthy weight Do not drive if drowsy       Fenton Foy, NP 10/10/2018

## 2018-10-10 NOTE — Assessment & Plan Note (Signed)
Stable interval - continue current medications   Patient Instructions  Continue Gabapentin  Continue Ambien Continue to work on healthy weight Do not drive if drowsy Please follow up with Dr. Halford Chessman in 6 months or sooner if needed  Insomnia Insomnia is a sleep disorder that makes it difficult to fall asleep or stay asleep. Insomnia can cause fatigue, low energy, difficulty concentrating, mood swings, and poor performance at work or school. There are three different ways to classify insomnia:  Difficulty falling asleep.  Difficulty staying asleep.  Waking up too early in the morning. Any type of insomnia can be long-term (chronic) or short-term (acute). Both are common. Short-term insomnia usually lasts for three months or less. Chronic insomnia occurs at least three times a week for longer than three months. What are the causes? Insomnia may be caused by another condition, situation, or substance, such as:  Anxiety.  Certain medicines.  Gastroesophageal reflux disease (GERD) or other gastrointestinal conditions.  Asthma or other breathing conditions.  Restless legs syndrome, sleep apnea, or other sleep disorders.  Chronic pain.  Menopause.  Stroke.  Abuse of alcohol, tobacco, or illegal drugs.  Mental health conditions, such as depression.  Caffeine.  Neurological disorders, such as Alzheimer's disease.  An overactive thyroid (hyperthyroidism). Sometimes, the cause of insomnia may not be known. What increases the risk? Risk factors for insomnia include:  Gender. Women are affected more often than men.  Age. Insomnia is more common as you get older.  Stress.  Lack of exercise.  Irregular work schedule or working night shifts.  Traveling between different time zones.  Certain medical and mental health conditions. What are the signs or symptoms? If you have insomnia, the main symptom is having trouble falling asleep or having trouble staying asleep. This  may lead to other symptoms, such as:  Feeling fatigued or having low energy.  Feeling nervous about going to sleep.  Not feeling rested in the morning.  Having trouble concentrating.  Feeling irritable, anxious, or depressed. How is this diagnosed? This condition may be diagnosed based on:  Your symptoms and medical history. Your health care provider may ask about: ? Your sleep habits. ? Any medical conditions you have. ? Your mental health.  A physical exam. How is this treated? Treatment for insomnia depends on the cause. Treatment may focus on treating an underlying condition that is causing insomnia. Treatment may also include:  Medicines to help you sleep.  Counseling or therapy.  Lifestyle adjustments to help you sleep better. Follow these instructions at home: Eating and drinking   Limit or avoid alcohol, caffeinated beverages, and cigarettes, especially close to bedtime. These can disrupt your sleep.  Do not eat a large meal or eat spicy foods right before bedtime. This can lead to digestive discomfort that can make it hard for you to sleep. Sleep habits   Keep a sleep diary to help you and your health care provider figure out what could be causing your insomnia. Write down: ? When you sleep. ? When you wake up during the night. ? How well you sleep. ? How rested you feel the next day. ? Any side effects of medicines you are taking. ? What you eat and drink.  Make your bedroom a dark, comfortable place where it is easy to fall asleep. ? Put up shades or blackout curtains to block light from outside. ? Use a white noise machine to block noise. ? Keep the temperature cool.  Limit screen use before bedtime.  This includes: ? Watching TV. ? Using your smartphone, tablet, or computer.  Stick to a routine that includes going to bed and waking up at the same times every day and night. This can help you fall asleep faster. Consider making a quiet activity, such  as reading, part of your nighttime routine.  Try to avoid taking naps during the day so that you sleep better at night.  Get out of bed if you are still awake after 15 minutes of trying to sleep. Keep the lights down, but try reading or doing a quiet activity. When you feel sleepy, go back to bed. General instructions  Take over-the-counter and prescription medicines only as told by your health care provider.  Exercise regularly, as told by your health care provider. Avoid exercise starting several hours before bedtime.  Use relaxation techniques to manage stress. Ask your health care provider to suggest some techniques that may work well for you. These may include: ? Breathing exercises. ? Routines to release muscle tension. ? Visualizing peaceful scenes.  Make sure that you drive carefully. Avoid driving if you feel very sleepy.  Keep all follow-up visits as told by your health care provider. This is important. Contact a health care provider if:  You are tired throughout the day.  You have trouble in your daily routine due to sleepiness.  You continue to have sleep problems, or your sleep problems get worse. Get help right away if:  You have serious thoughts about hurting yourself or someone else. If you ever feel like you may hurt yourself or others, or have thoughts about taking your own life, get help right away. You can go to your nearest emergency department or call:  Your local emergency services (911 in the [place]  A suicide crisis helpline, such as the [facility] at (801)486-3096. This is open 24 hours a day. Summary  Insomnia is a sleep disorder that makes it difficult to fall asleep or stay asleep.  Insomnia can be long-term (chronic) or short-term (acute).  Treatment for insomnia depends on the cause. Treatment may focus on treating an underlying condition that is causing insomnia.  Keep a sleep diary to help you and your health care  provider figure out what could be causing your insomnia. This information is not intended to replace advice given to you by your health care provider. Make sure you discuss any questions you have with your health care provider. Document Released: 10/06/2000 Document Revised: 07/19/2017 Document Reviewed: 07/19/2017 Elsevier Interactive Patient Education  2019 [employer].

## 2018-10-28 ENCOUNTER — Ambulatory Visit: Payer: BLUE CROSS/BLUE SHIELD | Admitting: Pulmonary Disease

## 2018-11-04 MED ORDER — ZOLPIDEM TARTRATE 10 MG PO TABS: 10.0000 mg | ORAL_TABLET | Freq: Every evening | ORAL | 5 refills | Status: DC | PRN

## 2018-11-04 MED ORDER — GABAPENTIN 300 MG PO CAPS: 600.0000 mg | ORAL_CAPSULE | Freq: Every day | ORAL | 5 refills | Status: DC

## 2018-11-04 MED ORDER — ZOLPIDEM TARTRATE ER 12.5 MG PO TBCR: 12.5000 mg | EXTENDED_RELEASE_TABLET | Freq: Every day | ORAL | 5 refills | Status: DC

## 2018-11-04 NOTE — Telephone Encounter (Signed)
All three meds have been called in to pt's pharmacy. Sent pt a mychart message letting her aware that this had been done. Nothing further needed.

## 2018-11-04 NOTE — Telephone Encounter (Signed)
Patient is asking for a refill on his Ambien 12.5mg , Ambien 10mg  and gabapentin 300mg . He was last seen by TN on 10/10/2018 and was advised to continue the mentioned medications. He was only advised to follow up with you in 6 months.   Dr. Halford Chessman, please advise if you are ok with him receiving these refills? Thanks!

## 2018-11-04 NOTE — Telephone Encounter (Signed)
Patient would like to use [facility] in [place].

## 2018-11-04 NOTE — Telephone Encounter (Signed)
Okay to send refills for ambien 12.5 mg, ambien 10 mg, and gabapentin 300 mg.

## 2019-05-05 ENCOUNTER — Other Ambulatory Visit: Payer: Self-pay | Admitting: Pulmonary Disease

## 2019-05-05 MED ORDER — GABAPENTIN 300 MG PO CAPS: 600.0000 mg | ORAL_CAPSULE | Freq: Every day | ORAL | 5 refills | Status: DC

## 2019-05-05 NOTE — Telephone Encounter (Signed)
Called and spoke with pt. Pt is requesting to have a refill of both gabapentin and ambien as he takes both meds to help with his insomnia. Both meds last filled for pt 11/04/2018 #60 caps with 5 RF for the gabapentin 300mg  capsule and #30tabs with 5 RF for the ambien CR 12.5mg  and #30tabs with 5 RF for the ambien 10mg .  Tonya, please advise if you are okay refilling meds for pt. Thanks!

## 2019-05-05 NOTE — Addendum Note (Signed)
Addended by: Lorretta Harp on: 05/05/2019 04:58 PM   Modules accepted: Orders

## 2019-05-05 NOTE — Telephone Encounter (Signed)
It looks like the Ambien was refilled on 04/08/19 and it had refills ordered. I approved the gabapentin. Thanks.

## 2019-05-05 NOTE — Telephone Encounter (Signed)
Called and spoke with pt letting him know that [place] refilled his gabapentin but stated that it seemed like both ambien Rx were filled 6/16 and she would not refill. Stated to him that both [facility] should have refills on it.  Pt stated when he went online to look at both [facility], it shows that the Rx have no refills on it. I again restated what TN said in regards to the Rx. Stated to pt to contact pharmacy in regards to this and stated to him that he might need pharmacy staff contact our office in regards to this and we could go from there. Pt verbalized understanding. Nothing further needed.

## 2019-05-05 NOTE — Telephone Encounter (Signed)
Are these appropriate for refill ?

## 2019-05-07 ENCOUNTER — Encounter: Payer: Self-pay | Admitting: Nurse Practitioner

## 2019-05-07 ENCOUNTER — Ambulatory Visit: Payer: BLUE CROSS/BLUE SHIELD | Admitting: Nurse Practitioner

## 2019-05-07 ENCOUNTER — Other Ambulatory Visit: Payer: Self-pay

## 2019-05-07 DIAGNOSIS — G4700 Insomnia, unspecified: Secondary | ICD-10-CM

## 2019-05-07 MED ORDER — ZOLPIDEM TARTRATE 10 MG PO TABS: 10.0000 mg | ORAL_TABLET | Freq: Every evening | ORAL | 5 refills | Status: DC | PRN

## 2019-05-07 MED ORDER — ZOLPIDEM TARTRATE ER 12.5 MG PO TBCR: 12.5000 mg | EXTENDED_RELEASE_TABLET | Freq: Every day | ORAL | 5 refills | Status: DC

## 2019-05-07 NOTE — Assessment & Plan Note (Signed)
Patient presents today for follow-up for insomnia and OSA.  He states that this is been a stable interval for him.  He is compliant with Neurontin and Ambien.  He is prescribed an extra 10 mg of Ambien that he can take at bedtime if needed.  He states that he only takes this around once per week.  He no longer uses his CPAP he was intolerant of this.  He has been working weight loss.  He feels like his lifestyle modifications have improved his sleep.  He has modified his diet and eating patterns as well.  Patient's refills for Ambien have expired and he would like these refilled today.  Patient Instructions  Continue Gabapentin  Continue Ambien Continue to work on healthy weight Do not drive if drowsy Please follow up with Dr. Halford Chessman in 6 months or sooner if needed

## 2019-05-07 NOTE — Progress Notes (Signed)
@Patient  ID: Jacob Ryan, male    DOB: Aug 17, 1971, 48 y.o.   MRN: [id number]  Chief Complaint  Patient presents with  . Follow-up    Referring provider: Alvester Chou, NP  HPI  48 year old male never smoker with insomnia and OSA (intolerant of CPAP) who is followed by Dr. Halford Chessman.  Tests: Sleep tests: PSG 05/15/12 >> AHI 3, SpO2 low 86%, PLMI 27.2, UARS PSG 05/20/16 >> AHI 12.7, SpO2 low 79%, PLMI 30.16 Auto CPAP 01/27/17 to 02/25/17 >>used on 17 of 30 nights with average 4 hrs 32 min. Average AHI 3.5 with median CPAP 8 and 95 th percentile CPAP 11 cm H2O  OV 05/07/19 - Follow up Patient presents today for follow-up for insomnia and OSA.  He states that this is been a stable interval for him.  He is compliant with Neurontin and Ambien.  He is prescribed an extra 10 mg of Ambien that he can take at bedtime if needed.  He states that he only takes this around once per week.  He no longer uses his CPAP he was intolerant of this.  He has been working weight loss.  He feels like his lifestyle modifications have improved his sleep.  He has modified his diet and eating patterns as well.  Patient's refills for Ambien have expired and he would like these refilled today.  Denies f/c/s, n/v/d, hemoptysis, PND, leg swelling.    Allergies  Allergen Reactions  . Amoxicillin   . Other     Feathers  . Pollen Extract   . Latex Rash    Immunization History  Administered Date(s) Administered  . Influenza Split 09/22/2013, 08/02/2017  . Influenza,inj,Quad PF,6+ Mos 07/06/2015, 08/24/2016, 09/06/2018    Past Medical History:  Diagnosis Date  . Chronic headaches   . Chronic neck and back pain   . Hypertension   . Insomnia   . OSA (obstructive sleep apnea) 05/31/2016  . Vision abnormalities     Tobacco History: Social History   Tobacco Use  Smoking Status Never Smoker  Smokeless Tobacco Never Used   Counseling given: Yes   Outpatient Encounter Medications as of 05/07/2019  Medication  Sig  . fenofibrate (TRICOR) 145 MG tablet Take 145 mg daily by mouth.  . gabapentin (NEURONTIN) 300 MG capsule Take 2 capsules (600 mg total) by mouth at bedtime. 2 pills nightly  . Glucosamine-Chondroitin 500-400 MG CAPS Frequency:   Dosage:0.0     Instructions:Glucosamine Chondr 1500 Complx ( CAPS, Oral )  Note:  . lisinopril (PRINIVIL,ZESTRIL) 20 MG tablet Take 40 mg by mouth daily.  . Multiple Vitamin (MULTI-VITAMINS) TABS Frequency:   Dosage:0.0     Instructions:Multi Vitamin Mens ( TABS, Oral )  Note:  . Omega-3 1000 MG CAPS Take 1 capsule by mouth daily.  Marland Kitchen zolpidem (AMBIEN CR) 12.5 MG CR tablet Take 1 tablet (12.5 mg total) by mouth at bedtime.  Marland Kitchen zolpidem (AMBIEN) 10 MG tablet Take 1 tablet (10 mg total) by mouth at bedtime as needed for sleep.  . [DISCONTINUED] clonazePAM (KLONOPIN) 0.5 MG tablet Take 1 tablet by mouth as needed.  . [DISCONTINUED] zolpidem (AMBIEN CR) 12.5 MG CR tablet Take 1 tablet (12.5 mg total) by mouth at bedtime.  . [DISCONTINUED] zolpidem (AMBIEN CR) 12.5 MG CR tablet Take 1 tablet (12.5 mg total) by mouth at bedtime.  . [DISCONTINUED] zolpidem (AMBIEN) 10 MG tablet Take 1 tablet (10 mg total) by mouth at bedtime as needed for sleep.  . [DISCONTINUED] zolpidem (AMBIEN) 10  MG tablet Take 1 tablet (10 mg total) by mouth at bedtime as needed for sleep.   No facility-administered encounter medications on file as of 05/07/2019.      Review of Systems  Review of Systems  Constitutional: Negative.  Negative for chills and fever.  HENT: Negative.   Respiratory: Negative for cough, shortness of breath and wheezing.   Cardiovascular: Negative.  Negative for chest pain, palpitations and leg swelling.  Gastrointestinal: Negative.   Allergic/Immunologic: Negative.   Neurological: Negative.   Psychiatric/Behavioral: Negative.        Physical Exam  BP 138/86 (BP Location: Left Arm, Cuff Size: Normal)   Pulse 61   Temp 98.5 F (36.9 C) (Oral)   Ht 6' (1.829 m)    Wt 185 lb 12.8 oz (84.3 kg)   SpO2 97%   BMI 25.20 kg/m   Wt Readings from Last 5 Encounters:  05/07/19 185 lb 12.8 oz (84.3 kg)  10/10/18 191 lb (86.6 kg)  04/30/18 186 lb (84.4 kg)  08/31/17 203 lb 12.8 oz (92.4 kg)  02/27/17 210 lb 9.6 oz (95.5 kg)     Physical Exam Vitals signs and nursing note reviewed.  Constitutional:      General: He is not in acute distress.    Appearance: He is well-developed.  Cardiovascular:     Rate and Rhythm: Normal rate and regular rhythm.  Pulmonary:     Effort: Pulmonary effort is normal. No respiratory distress.     Breath sounds: Normal breath sounds. No wheezing or rhonchi.  Musculoskeletal:        General: No swelling.  Skin:    General: Skin is warm and dry.  Neurological:     Mental Status: He is alert and oriented to person, place, and time.       Assessment & Plan:   Insomnia Patient presents today for follow-up for insomnia and OSA.  He states that this is been a stable interval for him.  He is compliant with Neurontin and Ambien.  He is prescribed an extra 10 mg of Ambien that he can take at bedtime if needed.  He states that he only takes this around once per week.  He no longer uses his CPAP he was intolerant of this.  He has been working weight loss.  He feels like his lifestyle modifications have improved his sleep.  He has modified his diet and eating patterns as well.  Patient's refills for Ambien have expired and he would like these refilled today.  Patient Instructions  Continue Gabapentin  Continue Ambien Continue to work on healthy weight Do not drive if drowsy Please follow up with Dr. Halford Chessman in 6 months or sooner if needed       Fenton Foy, NP 05/07/2019

## 2019-05-07 NOTE — Patient Instructions (Addendum)
Continue Gabapentin  Continue Ambien Continue to work on healthy weight Do not drive if drowsy  Please follow up with Dr. Halford Chessman in 6 months or sooner if needed

## 2019-06-14 NOTE — Progress Notes (Signed)
Reviewed and agree with assessment/plan.   Skylen Spiering, MD [facility] 10/18/2016, 12:24 PM Pager:  [phone]  

## 2019-11-02 ENCOUNTER — Other Ambulatory Visit: Payer: Self-pay | Admitting: Nurse Practitioner

## 2019-11-30 ENCOUNTER — Other Ambulatory Visit: Payer: Self-pay | Admitting: Nurse Practitioner

## 2019-12-01 NOTE — Telephone Encounter (Signed)
Dr. Halford Chessman, please advise if you are okay refilling med for pt.

## 2020-04-21 ENCOUNTER — Encounter: Payer: Self-pay | Admitting: Primary Care

## 2020-04-21 ENCOUNTER — Ambulatory Visit (INDEPENDENT_AMBULATORY_CARE_PROVIDER_SITE_OTHER): Payer: 59 | Admitting: Primary Care

## 2020-04-21 ENCOUNTER — Other Ambulatory Visit: Payer: Self-pay

## 2020-04-21 DIAGNOSIS — G4700 Insomnia, unspecified: Secondary | ICD-10-CM | POA: Diagnosis not present

## 2020-04-21 MED ORDER — ZOLPIDEM TARTRATE ER 12.5 MG PO TBCR: 12.5000 mg | EXTENDED_RELEASE_TABLET | Freq: Every day | ORAL | 5 refills | Status: DC

## 2020-04-21 MED ORDER — TRAZODONE HCL 50 MG PO TABS: ORAL_TABLET | ORAL | 0 refills | Status: DC

## 2020-04-21 MED ORDER — GABAPENTIN 300 MG PO CAPS: ORAL_CAPSULE | ORAL | 5 refills | Status: DC

## 2020-04-21 NOTE — Progress Notes (Signed)
Reviewed and agree with assessment/plan.   [name], MD [facility] 04/21/2020, 7:08 PM Pager:  [phone]

## 2020-04-21 NOTE — Progress Notes (Signed)
Virtual Visit via Telephone Note  I connected with Jacob Ryan on 04/21/20 at 11:30 AM EDT by telephone and verified that I am speaking with the correct person using two identifiers.  Location: Patient: Home Provider: Office   I discussed the limitations, risks, security and privacy concerns of performing an evaluation and management service by telephone and the availability of in person appointments. I also discussed with the patient that there may be a patient responsible charge related to this service. The patient expressed understanding and agreed to proceed.   History of Present Illness: 49 year old male, never smoked. PMH significant for insomnia, mild OSA (intolerant to CPAP), GERD. Patient of Dr. Halford Chessman, last seen on 05/07/19 by pulmonary NP.     Previous [facility] pulmonary encounters:   Jacob Ryan is a 49 y.o. 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  He has hx of OSA, but improved with weight loss and was intolerant of CPAP.  Since his last visit he had sinus surgery with Dr. Janace Hoard.  Still has sinus congestion and now has more nasal voice.  Hasn't had sinus infection recently, but winter time is usually when he gets these.  He hasn't been seen by an allergist, and isn't using any nose sprays at present.  He was told that he would need to see a plastic surgeon if he needed to have further work on his deviated septum.  He changed his diet.  With this he has lost 25 lbs since last year.  This has helped his sleep and snoring.  He also has been working with a Restaurant manager, fast food and this has significantly improved his neck pain.  He is not eating dinner at 9 pm.  He takes his medication at 11 pm, and then goes to sleep.  He is using 600 mg gabapentin and 12.5 ambien cr.  About 1 or 2 times per week he will use ambien 10 mg.  04/21/2020- interim hx Patient contracted today for televisit for medication refill. He is doing well, no complaints. He feels current medication  regimen works for him. It takes him 17min to fall asleep with sleep aid. He does report some troubles with memory/recall. He is unsure if this is due to age or medication. He takes Ambien 12.5 CR every night for insomnia and gabapentin 300mg  for back/neck pain. About 1-2 times a week he will use Ambien 10mg . He has been on Belsomra in the past but this didn't not work well for him.   Observations/Objective:  - No shortness of breath, wheezing or cough   Sleep tests: PSG 05/15/12 >> AHI 3, SpO2 low 86%, PLMI 27.2, UARS PSG 05/20/16 >> AHI 12.7, SpO2 low 79%, PLMI 30.16 Auto CPAP 01/27/17 to 02/25/17 >>used on 17 of 30 nights with average 4 hrs 32 min. Average AHI 3.5 with median CPAP 8 and 95 th percentile CPAP 11 cm H2O  Assessment and Plan:  Insomnia: - Long standing hx of insomnia d/t psychologic stress and chronic pain. He has been taking Ambien for some time and reports that it is effective, however, he has noticed some issues with memory. He is unsure if this is related to age or medication  - Recommend trial Trazodone 50-100mg  at bedtime x 2 weeks (patient clearly understands he is not to take this with Ambien).  If Trazodone is ineffective, he can resume Ambien 12.5 CR qhs. Discontinued Ambien 10mg . Continue Neurontin 300mg  at bedtime for back/neck pain. PMP reviewed, no unexpected prescriptions found.  Reviewed safe use of medication. Do not combine with other sedating medication, drink alcohol in excess with use or drink while taking. Refills sent to pharmacy   Follow Up Instructions:   - FU 2 week televisit   I discussed the assessment and treatment plan with the patient. The patient was provided an opportunity to ask questions and all were answered. The patient agreed with the plan and demonstrated an understanding of the instructions.   The patient was advised to call back or seek an in-person evaluation if the symptoms worsen or if the condition fails to improve as anticipated.  I  provided 22 minutes of non-face-to-face time during this encounter.   Martyn Ehrich, NP

## 2020-04-21 NOTE — Patient Instructions (Signed)
Trial trazodone 1-2 tablets at bedtime (start with 50mg ) Do not take ambien with Trazodone  Follow up in 2 week televisit

## 2020-05-12 ENCOUNTER — Ambulatory Visit: Payer: BC Managed Care – PPO | Admitting: Primary Care

## 2020-05-14 ENCOUNTER — Other Ambulatory Visit: Payer: Self-pay | Admitting: Primary Care

## 2020-06-30 ENCOUNTER — Other Ambulatory Visit: Payer: Self-pay | Admitting: Primary Care

## 2020-06-30 NOTE — Telephone Encounter (Signed)
So I am declining trazodone refill. He should nto be on both.

## 2020-06-30 NOTE — Telephone Encounter (Signed)
Is he taking Ambien or Trazodone?

## 2020-06-30 NOTE — Telephone Encounter (Signed)
Pt stated he taking ambien and he good on refills right now

## 2020-06-30 NOTE — Telephone Encounter (Signed)
Call you call and ask patient if he is taking Trazodone or ambien for sleep?

## 2020-06-30 NOTE — Telephone Encounter (Signed)
Pt is requesting 90 day supply of trazodone next appt 10/03/20 with dr Halford Chessman

## 2020-08-27 ENCOUNTER — Other Ambulatory Visit: Payer: Self-pay | Admitting: Primary Care

## 2020-10-05 NOTE — Telephone Encounter (Signed)
Patient sent a message through my chart requesting a follow up.  Patient wanted virtual appointment, if possible. Beth, are you ok with virtual OV for refill follow up? LOV- 04/21/20, with Eustaquio Maize, NP

## 2020-10-05 NOTE — Telephone Encounter (Signed)
Yes that is fine

## 2020-10-11 ENCOUNTER — Other Ambulatory Visit: Payer: Self-pay

## 2020-10-11 ENCOUNTER — Ambulatory Visit (INDEPENDENT_AMBULATORY_CARE_PROVIDER_SITE_OTHER): Payer: 59 | Admitting: Primary Care

## 2020-10-11 DIAGNOSIS — G4700 Insomnia, unspecified: Secondary | ICD-10-CM | POA: Diagnosis not present

## 2020-10-11 MED ORDER — ZOLPIDEM TARTRATE ER 12.5 MG PO TBCR: 12.5000 mg | EXTENDED_RELEASE_TABLET | Freq: Every day | ORAL | 5 refills | Status: DC

## 2020-10-11 NOTE — Progress Notes (Signed)
Virtual Visit via Telephone Note  I connected with Jacob Ryan on 10/11/20 at  3:00 PM EST by telephone and verified that I am speaking with the correct person using two identifiers.  Location: Patient: Home Provider: Office   I discussed the limitations, risks, security and privacy concerns of performing an evaluation and management service by telephone and the availability of in person appointments. I also discussed with the patient that there may be a patient responsible charge related to this service. The patient expressed understanding and agreed to proceed.   History of Present Illness: 49 year old male, never smoked. PMH significant for insomnia, mild OSA (intolerant to CPAP), GERD. Patient of Dr. Halford Chessman. PSG 05/20/16 >> AHI 12.7, SpO2 low 79%, intolerant to CPAP.    Previous [facility] pulmonary encounters:  Jacob Ryan is a 49 y.o. 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  He has hx of OSA, but improved with weight loss and was intolerant of CPAP.  Since his last visit he had sinus surgery with Dr. Janace Hoard.  Still has sinus congestion and now has more nasal voice.  Hasn't had sinus infection recently, but winter time is usually when he gets these.  He hasn't been seen by an allergist, and isn't using any nose sprays at present.  He was told that he would need to see a plastic surgeon if he needed to have further work on his deviated septum.  He changed his diet.  With this he has lost 25 lbs since last year.  This has helped his sleep and snoring.  He also has been working with a Restaurant manager, fast food and this has significantly improved his neck pain.  He is not eating dinner at 9 pm.  He takes his medication at 11 pm, and then goes to sleep.  He is using 600 mg gabapentin and 12.5 ambien cr.  About 1 or 2 times per week he will use ambien 10 mg.  04/21/2020 Patient contracted today for televisit for medication refill. He is doing well, no complaints. He feels current  medication regimen works for him. It takes him 59min to fall asleep with sleep aid. He does report some troubles with memory/recall. He is unsure if this is due to age or medication. He takes Ambien 12.5 CR every night for insomnia and gabapentin 300mg  for back/neck pain. About 1-2 times a week he will use Ambien 10mg . He has been on Belsomra in the past but this didn't not work well for him.  10/11/2020- Interim hx  Patient contacted today for televisit, needing medication refill. During last visit he reported some memory issues with Ambien, he was unsure if this was related to medication or age.  We recommended trial trazodone 50-100mg  at bedtime x 2 weeks. Ok to continue Gabapentin 300mg  at bedtime for chronic pain. If trazodone ineffective he can resume Ambien. Advised he follow-up in 2 week which did not happen.   States that he is sleeping better, he still snores. He is intolerant to CPAP. He does not wake up short of breath or gasping for air. Reports current weight 171lbs. He attempted to try trazodone but was unable to sleep. He is back to taking Ambien 12.5mg  qhs and gabapentin 600mg  at bedtime for neck/back pain. This is working well for him. Still has issues with forgetting things at times and other times he does not have an issue. He will often need to be reminded of something and then he remembers. No change in neurologic  symptoms, no issues with remembering his name, address, current president or date.   Observations/Objective:  - Able to speak in full sentences; no shortness of breath  Sleep tests: PSG 05/15/12 >> AHI 3, SpO2 low 86%, PLMI 27.2, UARS PSG 05/20/16 >> AHI 12.7, SpO2 low 79%, PLMI 30.16 Auto CPAP 01/27/17 to 02/25/17 >>used on 17 of 30 nights with average 4 hrs 32 min. Average AHI 3.5 with median CPAP 8 and 95 th percentile CPAP 11 cm H2O  Assessment and Plan:  Insomnia: - Long standing hx insomnia d/t psychologic stress and chronic pain. During last visit in June tried  to switch to trazodone but not effective. Refill Ambien 12.5mg  qhs and continue gabapentin 600mg  at bedtime for neck/back pain. PMP reviewed, no unexpected prescriptions found. Reviewed safe use, patient to get full 7-8 hours of sleep. Do not take while driving or combine with other sedating medication/alcohol.   Follow Up Instructions:  - 6 months with Dr. Halford Chessman   I discussed the assessment and treatment plan with the patient. The patient was provided an opportunity to ask questions and all were answered. The patient agreed with the plan and demonstrated an understanding of the instructions.   The patient was advised to call back or seek an in-person evaluation if the symptoms worsen or if the condition fails to improve as anticipated.  I provided 15 minutes of non-face-to-face time during this encounter.   Martyn Ehrich, NP

## 2020-10-25 ENCOUNTER — Encounter: Payer: Self-pay | Admitting: Primary Care

## 2020-10-25 ENCOUNTER — Other Ambulatory Visit: Payer: Self-pay | Admitting: Primary Care

## 2020-10-25 NOTE — Progress Notes (Signed)
Reviewed and agree with assessment/plan.   Keashia Haskins, MD [facility] 10/25/2020, 9:06 AM Pager:  [phone]  

## 2020-10-25 NOTE — Patient Instructions (Signed)
Refill ambien 12.5mg   FU 6 months

## 2021-01-10 ENCOUNTER — Telehealth: Payer: Self-pay | Admitting: Pulmonary Disease

## 2021-01-10 NOTE — Telephone Encounter (Signed)
Pt has not seen Dr. Halford Chessman since 04/30/18. Pt last saw Beth, NP 10/11/20.  Dr. Halford Chessman, please advise if you are okay with pt scheduling a televisit with you with his last OV with you being 04/30/18 or if pt's ROV with you needs to be in-office.

## 2021-01-10 NOTE — Telephone Encounter (Signed)
Video visit okay

## 2021-01-11 NOTE — Telephone Encounter (Signed)
I have called the pt and LM on VM to make him aware that we will put the recall in for June.

## 2021-03-29 ENCOUNTER — Ambulatory Visit: Payer: Self-pay | Admitting: Surgery

## 2021-03-29 NOTE — H&P (Signed)
Subjective:   CC: Non-recurrent bilateral inguinal hernia without obstruction or gangrene [K40.20]  HPI:  Jacob Ryan is a 50 y.o. male who was referred by Velna Ochs, MD for evaluation of above. Symptoms were first noted 1 year ago. Pain is intermittent and discomfort, confined to the bilateral groin, without radiation.  Associated with constipation, exacerbated by straining.  Lump is reducible.    Past Medical History:  has a past medical history of Hyperlipidemia and Hypertension.  Past Surgical History:  Past Surgical History:  Procedure Laterality Date  . RH Tenden in Elbow      Family History: reviewed and not relevant to CC  Social History:  reports that he has never smoked. He has never used smokeless tobacco. He reports that he does not drink alcohol and does not use drugs.  Current Medications: has a current medication list which includes the following prescription(s): gabapentin, glucosamine-chondroitin, lisinopril, magnesium, multivitamin, omega-3 fatty acids-fish oil, ergocalciferol (vitamin d2), and zolpidem.  Allergies:  Allergies as of 03/29/2021 - Reviewed 03/29/2021  Allergen Reaction Noted  . Amoxicillin Hives 08/16/2020  . Latex Rash 06/30/2014    ROS:  A 15 point review of systems was performed and pertinent positives and negatives noted in HPI   Objective:     BP 108/67 (BP Location: Left upper arm, Patient Position: Sitting, BP Cuff Size: Adult)   Pulse 60   Ht 181.6 cm (5' 11.5")   BMI 23.24 kg/m   Constitutional :  alert, appears stated age, cooperative and no distress  Lymphatics/Throat:  no asymmetry, masses, or scars  Respiratory:  clear to auscultation bilaterally  Cardiovascular:  regular rate and rhythm  Gastrointestinal: soft, non-tender; bowel sounds normal; no masses,  no organomegaly. inguinal hernia noted.  very small? bilateral TTP at pubic tubercl  Musculoskeletal: Steady gait and movement  Skin: Cool and moist, no surgical  scars   Psychiatric: Normal affect, non-agitated, not confused       LABS:  n/a   RADS: n/a Assessment:       Non-recurrent bilateral inguinal hernia without obstruction or gangrene [K40.20]   Very small, but history consistent.  Offered imaging vs OR exploration and repair if found.  Pt requested proceeding with surgery.  Plan:     1. Non-recurrent bilateral inguinal hernia without obstruction or gangrene [K40.20]   Discussed the risk of surgery including recurrence, which can be up to 50% in the case of incisional or complex hernias, possible use of prosthetic materials (mesh) and the increased risk of mesh infxn if used, bleeding, chronic pain, post-op infxn, post-op SBO or ileus, and possible re-operation to address said risks. The risks of general anesthetic, if used, includes MI, CVA, sudden death or even reaction to anesthetic medications also discussed. Alternatives include continued observation.  Benefits include possible symptom relief, prevention of incarceration, strangulation, enlargement in size over time, and the risk of emergency surgery in the face of strangulation.   Typical post-op recovery time of 3-5 days with 2 weeks of activity restrictions were also discussed.  ED return precautions given for sudden increase in pain, size of hernia with accompanying fever, nausea, and/or vomiting.  The patient verbalized understanding and all questions were answered to the patient's satisfaction.   2. Patient has elected to proceed with surgical treatment. Procedure will be scheduled.  Written consent was obtained.Marland Kitchen BILATERAL IF FOUND, robotic assisted laparoscopic.

## 2021-03-29 NOTE — H&P (View-Only) (Signed)
Subjective:   CC: Non-recurrent bilateral inguinal hernia without obstruction or gangrene [K40.20]  HPI:  Jacob Ryan is a 50 y.o. male who was referred by Velna Ochs, MD for evaluation of above. Symptoms were first noted 1 year ago. Pain is intermittent and discomfort, confined to the bilateral groin, without radiation.  Associated with constipation, exacerbated by straining.  Lump is reducible.    Past Medical History:  has a past medical history of Hyperlipidemia and Hypertension.  Past Surgical History:  Past Surgical History:  Procedure Laterality Date  . RH [place] in [place]      Family History: reviewed and not relevant to CC  Social History:  reports that he has never smoked. He has never used smokeless tobacco. He reports that he does not drink alcohol and does not use drugs.  Current Medications: has a current medication list which includes the following prescription(s): gabapentin, glucosamine-chondroitin, lisinopril, magnesium, multivitamin, omega-3 fatty acids-fish oil, ergocalciferol (vitamin d2), and zolpidem.  Allergies:  Allergies as of 03/29/2021 - Reviewed 03/29/2021  Allergen Reaction Noted  . Amoxicillin Hives 08/16/2020  . Latex Rash 06/30/2014    ROS:  A 15 point review of systems was performed and pertinent positives and negatives noted in HPI   Objective:     BP 108/67 (BP Location: Left upper arm, Patient Position: Sitting, BP Cuff Size: Adult)   Pulse 60   Ht 181.6 cm (5' 11.5")   BMI 23.24 kg/m   Constitutional :  alert, appears stated age, cooperative and no distress  Lymphatics/Throat:  no asymmetry, masses, or scars  Respiratory:  clear to auscultation bilaterally  Cardiovascular:  regular rate and rhythm  Gastrointestinal: soft, non-tender; bowel sounds normal; no masses,  no organomegaly. inguinal hernia noted.  very small? bilateral TTP at pubic tubercl  Musculoskeletal: Steady gait and movement  Skin: Cool and moist, no surgical  scars   Psychiatric: Normal affect, non-agitated, not confused       LABS:  n/a   RADS: n/a Assessment:       Non-recurrent bilateral inguinal hernia without obstruction or gangrene [K40.20]   Very small, but history consistent.  Offered imaging vs OR exploration and repair if found.  Pt requested proceeding with surgery.  Plan:     1. Non-recurrent bilateral inguinal hernia without obstruction or gangrene [K40.20]   Discussed the risk of surgery including recurrence, which can be up to 50% in the case of incisional or complex hernias, possible use of prosthetic materials (mesh) and the increased risk of mesh infxn if used, bleeding, chronic pain, post-op infxn, post-op SBO or ileus, and possible re-operation to address said risks. The risks of general anesthetic, if used, includes MI, CVA, sudden death or even reaction to anesthetic medications also discussed. Alternatives include continued observation.  Benefits include possible symptom relief, prevention of incarceration, strangulation, enlargement in size over time, and the risk of emergency surgery in the face of strangulation.   Typical post-op recovery time of 3-5 days with 2 weeks of activity restrictions were also discussed.  ED return precautions given for sudden increase in pain, size of hernia with accompanying fever, nausea, and/or vomiting.  The patient verbalized understanding and all questions were answered to the patient's satisfaction.   2. Patient has elected to proceed with surgical treatment. Procedure will be scheduled.  Written consent was obtained.Marland Kitchen BILATERAL IF FOUND, robotic assisted laparoscopic.

## 2021-04-08 ENCOUNTER — Encounter
Admission: RE | Admit: 2021-04-08 | Discharge: 2021-04-08 | Disposition: A | Discharge status: Home / Self Care | Payer: 59 | Source: Ambulatory Visit | Attending: Surgery | Admitting: Surgery

## 2021-04-08 ENCOUNTER — Other Ambulatory Visit: Payer: Self-pay

## 2021-04-08 NOTE — Patient Instructions (Addendum)
Your procedure is scheduled on: Friday, June 24 Report to the Registration Desk on the 1st floor of the [facility]. To find out your arrival time, please call [phone] between 1PM - 3PM on: Thursday, June 23  REMEMBER: Instructions that are not followed completely may result in serious medical risk, up to and including death; or upon the discretion of your surgeon and anesthesiologist your surgery may need to be rescheduled.  Do not eat food after midnight the night before surgery.  No gum chewing, lozengers or hard candies.  You may however, drink CLEAR liquids up to 2 hours before you are scheduled to arrive for your surgery. Do not drink anything within 2 hours of your scheduled arrival time.  Clear liquids include: - water  - apple juice without pulp - gatorade (not RED, PURPLE, OR BLUE) - black coffee or tea (Do NOT add milk or creamers to the coffee or tea) Do NOT drink anything that is not on this list.  DO NOT TAKE ANY MEDICATIONS THE MORNING OF SURGERY   One week prior to surgery: STARTING June 17 Stop Anti-inflammatories (NSAIDS) such as Advil, Aleve, Ibuprofen, Motrin, Naproxen, Naprosyn and Aspirin based products such as Excedrin, Goodys Powder, BC Powder. Stop ANY OVER THE COUNTER supplements until after surgery. (BARBERRY, VITAMIN D, CINNAMON, GLUCOSAMINE, MULTI-VITAMIN, OMEGA-3, VITAMIN C) You may however, continue to take Tylenol if needed for pain up until the day of surgery.  No Alcohol for 24 hours before or after surgery.  No Smoking including e-cigarettes for 24 hours prior to surgery.  No chewable tobacco products for at least 6 hours prior to surgery.  No nicotine patches on the day of surgery.  Do not use any "recreational" drugs for at least a week prior to your surgery.  Please be advised that the combination of cocaine and anesthesia may have negative outcomes, up to and including death. If you test positive for cocaine, your surgery will be  cancelled.  On the morning of surgery brush your teeth with toothpaste and water, you may rinse your mouth with mouthwash if you wish. Do not swallow any toothpaste or mouthwash.  Do not wear jewelry, make-up, hairpins, clips or nail polish.  Do not wear lotions, powders, or perfumes.   Do not shave body from the neck down 48 hours prior to surgery just in case you cut yourself which could leave a site for infection.  Also, freshly shaved skin may become irritated if using the CHG soap.  Contact lenses, hearing aids and dentures may not be worn into surgery.  Do not bring valuables to the hospital. [facility] is not responsible for any missing/lost belongings or valuables.   Use CHG Soap as directed on instruction sheet.  Notify your doctor if there is any change in your medical condition (cold, fever, infection).  Wear comfortable clothing (specific to your surgery type) to the hospital.  After surgery, you can help prevent lung complications by doing breathing exercises.  Take deep breaths and cough every 1-2 hours. Your doctor may order a device called an Incentive Spirometer to help you take deep breaths. When coughing or sneezing, hold a pillow firmly against your incision with both hands. This is called "splinting." Doing this helps protect your incision. It also decreases belly discomfort.  If you are being discharged the day of surgery, you will not be allowed to drive home. You will need a responsible adult (18 years or older) to drive you home and stay with  you that night.   If you are taking public transportation, you will need to have a responsible adult (18 years or older) with you. Please confirm with your physician that it is acceptable to use public transportation.   Please call the [facility] at [phone] if you have any questions about these instructions.  Surgery Visitation Policy:  Patients undergoing a surgery or procedure may have one  family member or support person with them as long as that person is not COVID-19 positive or experiencing its symptoms.  That person may remain in the waiting area during the procedure.

## 2021-04-11 ENCOUNTER — Encounter
Admission: RE | Admit: 2021-04-11 | Discharge: 2021-04-11 | Disposition: A | Discharge status: Home / Self Care | Payer: 59 | Source: Ambulatory Visit | Attending: Surgery | Admitting: Surgery

## 2021-04-11 ENCOUNTER — Other Ambulatory Visit: Payer: Self-pay

## 2021-04-11 DIAGNOSIS — Z01818 Encounter for other preprocedural examination: Secondary | ICD-10-CM | POA: Diagnosis not present

## 2021-04-11 DIAGNOSIS — I10 Essential (primary) hypertension: Secondary | ICD-10-CM | POA: Insufficient documentation

## 2021-04-11 LAB — CBC
HCT: 39.7 % (ref 39.0–52.0)
Hemoglobin: 14.1 g/dL (ref 13.0–17.0)
MCH: 32 pg (ref 26.0–34.0)
MCHC: 35.5 g/dL (ref 30.0–36.0)
MCV: 90.2 fL (ref 80.0–100.0)
Platelets: 161 10*3/uL (ref 150–400)
RBC: 4.4 MIL/uL (ref 4.22–5.81)
RDW: 12.3 % (ref 11.5–15.5)
WBC: 5.1 10*3/uL (ref 4.0–10.5)
nRBC: 0 % (ref 0.0–0.2)

## 2021-04-11 LAB — BASIC METABOLIC PANEL
Anion gap: 8 (ref 5–15)
BUN: 19 mg/dL (ref 6–20)
CO2: 26 mmol/L (ref 22–32)
Calcium: 9 mg/dL (ref 8.9–10.3)
Chloride: 106 mmol/L (ref 98–111)
Creatinine, Ser: 0.96 mg/dL (ref 0.61–1.24)
GFR, Estimated: >60 mL/min (ref >60)
Glucose, Bld: 121 mg/dL — ABNORMAL HIGH (ref 70–99)
Potassium: 3.7 mmol/L (ref 3.5–5.1)
Sodium: 140 mmol/L (ref 135–145)

## 2021-04-14 MED ORDER — CEFAZOLIN SODIUM-DEXTROSE 2-4 GM/100ML-% IV SOLN
2.0000 g | INTRAVENOUS | Status: AC
  Administered 2021-04-15: 2 g via INTRAVENOUS

## 2021-04-14 MED ORDER — FAMOTIDINE 20 MG PO TABS
20.0000 mg | ORAL_TABLET | Freq: Once | ORAL | Status: AC
  Administered 2021-04-15: 20 mg via ORAL

## 2021-04-14 MED ORDER — ACETAMINOPHEN 500 MG PO TABS
1000.0000 mg | ORAL_TABLET | ORAL | Status: AC
  Administered 2021-04-15: 1000 mg via ORAL

## 2021-04-14 MED ORDER — CELECOXIB 200 MG PO CAPS
200.0000 mg | ORAL_CAPSULE | ORAL | Status: AC
  Administered 2021-04-15: 200 mg via ORAL

## 2021-04-14 MED ORDER — CHLORHEXIDINE GLUCONATE CLOTH 2 % EX PADS
6.0000 | MEDICATED_PAD | Freq: Once | CUTANEOUS | Status: DC
Start: 2021-04-14 — End: 2021-04-15

## 2021-04-14 MED ORDER — CHLORHEXIDINE GLUCONATE 0.12 % MT SOLN
15.0000 mL | Freq: Once | OROMUCOSAL | Status: AC
  Administered 2021-04-15: 15 mL via OROMUCOSAL

## 2021-04-14 MED ORDER — LACTATED RINGERS IV SOLN: INTRAVENOUS | Status: DC

## 2021-04-14 MED ORDER — ORAL CARE MOUTH RINSE: 15.0000 mL | Freq: Once | OROMUCOSAL | Status: AC

## 2021-04-15 ENCOUNTER — Ambulatory Visit: Payer: 59 | Admitting: Certified Registered Nurse Anesthetist

## 2021-04-15 ENCOUNTER — Encounter
Admission: RE | Disposition: A | Discharge status: Home / Self Care | Payer: Self-pay | Source: Home / Self Care | Attending: Surgery

## 2021-04-15 ENCOUNTER — Ambulatory Visit
Admission: RE | Admit: 2021-04-15 | Discharge: 2021-04-15 | Disposition: A | Discharge status: Home / Self Care | Payer: 59 | Attending: Surgery | Admitting: Surgery

## 2021-04-15 ENCOUNTER — Other Ambulatory Visit: Payer: Self-pay

## 2021-04-15 ENCOUNTER — Encounter: Payer: Self-pay | Admitting: Surgery

## 2021-04-15 DIAGNOSIS — Z880 Allergy status to penicillin: Secondary | ICD-10-CM | POA: Insufficient documentation

## 2021-04-15 DIAGNOSIS — Z91040 Latex allergy status: Secondary | ICD-10-CM | POA: Diagnosis not present

## 2021-04-15 DIAGNOSIS — K5900 Constipation, unspecified: Secondary | ICD-10-CM | POA: Diagnosis not present

## 2021-04-15 DIAGNOSIS — K4020 Bilateral inguinal hernia, without obstruction or gangrene, not specified as recurrent: Secondary | ICD-10-CM | POA: Insufficient documentation

## 2021-04-15 DIAGNOSIS — K4090 Unilateral inguinal hernia, without obstruction or gangrene, not specified as recurrent: Secondary | ICD-10-CM

## 2021-04-15 HISTORY — PX: XI ROBOTIC ASSISTED INGUINAL HERNIA REPAIR WITH MESH: SHX6706

## 2021-04-15 SURGERY — REPAIR, HERNIA, INGUINAL, ROBOT-ASSISTED, LAPAROSCOPIC, USING MESH
Anesthesia: General | Site: Groin | Laterality: Left

## 2021-04-15 MED ORDER — DEXAMETHASONE SODIUM PHOSPHATE 10 MG/ML IJ SOLN
INTRAMUSCULAR | Status: AC
  Filled 2021-04-15: qty 1

## 2021-04-15 MED ORDER — FENTANYL CITRATE (PF) 100 MCG/2ML IJ SOLN
INTRAMUSCULAR | Status: AC
  Filled 2021-04-15: qty 2

## 2021-04-15 MED ORDER — IBUPROFEN 800 MG PO TABS: 800.0000 mg | ORAL_TABLET | Freq: Three times a day (TID) | ORAL | 0 refills | Status: DC | PRN

## 2021-04-15 MED ORDER — OXYCODONE HCL 5 MG/5ML PO SOLN: 5.0000 mg | Freq: Once | ORAL | Status: DC | PRN

## 2021-04-15 MED ORDER — BUPIVACAINE-EPINEPHRINE 0.5% -1:200000 IJ SOLN
INTRAMUSCULAR | Status: DC | PRN
  Administered 2021-04-15: 12 mL

## 2021-04-15 MED ORDER — PROPOFOL 10 MG/ML IV BOLUS
INTRAVENOUS | Status: DC | PRN
  Administered 2021-04-15: 140 mg via INTRAVENOUS

## 2021-04-15 MED ORDER — FENTANYL CITRATE (PF) 100 MCG/2ML IJ SOLN
25.0000 ug | INTRAMUSCULAR | Status: DC | PRN
  Administered 2021-04-15 (×2): 25 ug via INTRAVENOUS

## 2021-04-15 MED ORDER — DEXAMETHASONE SODIUM PHOSPHATE 10 MG/ML IJ SOLN
INTRAMUSCULAR | Status: DC | PRN
  Administered 2021-04-15: 8 mg via INTRAVENOUS

## 2021-04-15 MED ORDER — SUGAMMADEX SODIUM 200 MG/2ML IV SOLN
INTRAVENOUS | Status: DC | PRN
  Administered 2021-04-15: 200 mg via INTRAVENOUS

## 2021-04-15 MED ORDER — ACETAMINOPHEN 325 MG PO TABS: 650.0000 mg | ORAL_TABLET | Freq: Three times a day (TID) | ORAL | 0 refills | Status: AC | PRN

## 2021-04-15 MED ORDER — ONDANSETRON HCL 4 MG/2ML IJ SOLN
INTRAMUSCULAR | Status: AC
  Filled 2021-04-15: qty 2

## 2021-04-15 MED ORDER — MIDAZOLAM HCL 2 MG/2ML IJ SOLN
INTRAMUSCULAR | Status: DC | PRN
  Administered 2021-04-15: 2 mg via INTRAVENOUS

## 2021-04-15 MED ORDER — HYDROCODONE-ACETAMINOPHEN 5-325 MG PO TABS
1.0000 | ORAL_TABLET | Freq: Four times a day (QID) | ORAL | Status: DC | PRN
  Administered 2021-04-15: 1 via ORAL

## 2021-04-15 MED ORDER — PROMETHAZINE HCL 25 MG/ML IJ SOLN: 6.2500 mg | INTRAMUSCULAR | Status: DC | PRN

## 2021-04-15 MED ORDER — FENTANYL CITRATE (PF) 100 MCG/2ML IJ SOLN
INTRAMUSCULAR | Status: DC | PRN
  Administered 2021-04-15 (×2): 50 ug via INTRAVENOUS

## 2021-04-15 MED ORDER — OXYCODONE HCL 5 MG PO TABS
5.0000 mg | ORAL_TABLET | Freq: Once | ORAL | Status: DC | PRN
Start: 2021-04-15 — End: 2021-04-15

## 2021-04-15 MED ORDER — HYDROCODONE-ACETAMINOPHEN 5-325 MG PO TABS: 1.0000 | ORAL_TABLET | Freq: Four times a day (QID) | ORAL | 0 refills | Status: DC | PRN

## 2021-04-15 MED ORDER — MIDAZOLAM HCL 2 MG/2ML IJ SOLN
INTRAMUSCULAR | Status: AC
  Filled 2021-04-15: qty 2

## 2021-04-15 MED ORDER — ONDANSETRON HCL 4 MG/2ML IJ SOLN
INTRAMUSCULAR | Status: DC | PRN
  Administered 2021-04-15: 4 mg via INTRAVENOUS

## 2021-04-15 MED ORDER — ROCURONIUM BROMIDE 100 MG/10ML IV SOLN
INTRAVENOUS | Status: DC | PRN
  Administered 2021-04-15: 10 mg via INTRAVENOUS
  Administered 2021-04-15: 50 mg via INTRAVENOUS

## 2021-04-15 MED ORDER — ROCURONIUM BROMIDE 10 MG/ML (PF) SYRINGE
PREFILLED_SYRINGE | INTRAVENOUS | Status: AC
  Filled 2021-04-15: qty 10

## 2021-04-15 MED ORDER — IBUPROFEN 800 MG PO TABS
800.0000 mg | ORAL_TABLET | Freq: Three times a day (TID) | ORAL | Status: DC | PRN
  Administered 2021-04-15: 800 mg via ORAL
  Filled 2021-04-15: qty 1

## 2021-04-15 MED ORDER — BUPIVACAINE LIPOSOME 1.3 % IJ SUSP
INTRAMUSCULAR | Status: DC | PRN
  Administered 2021-04-15: 20 mL

## 2021-04-15 MED ORDER — GABAPENTIN 300 MG PO CAPS: 600.0000 mg | ORAL_CAPSULE | Freq: Every day | ORAL | Status: DC

## 2021-04-15 MED ORDER — 0.9 % SODIUM CHLORIDE (POUR BTL) OPTIME
TOPICAL | Status: DC | PRN
  Administered 2021-04-15: 100 mL

## 2021-04-15 MED ORDER — LIDOCAINE HCL (CARDIAC) PF 100 MG/5ML IV SOSY
PREFILLED_SYRINGE | INTRAVENOUS | Status: DC | PRN
  Administered 2021-04-15: 80 mg via INTRAVENOUS

## 2021-04-15 MED ORDER — MEPERIDINE HCL 25 MG/ML IJ SOLN: 6.2500 mg | INTRAMUSCULAR | Status: DC | PRN

## 2021-04-15 MED ORDER — DOCUSATE SODIUM 100 MG PO CAPS: 100.0000 mg | ORAL_CAPSULE | Freq: Two times a day (BID) | ORAL | 0 refills | Status: AC | PRN

## 2021-04-15 MED ORDER — LIDOCAINE HCL (PF) 2 % IJ SOLN
INTRAMUSCULAR | Status: AC
  Filled 2021-04-15: qty 5

## 2021-04-15 SURGICAL SUPPLY — 55 items
"PENCIL ELECTRO HAND CTR " (MISCELLANEOUS) ×1 IMPLANT
ADH SKN CLS APL DERMABOND .7 (GAUZE/BANDAGES/DRESSINGS) ×1
APL PRP STRL LF DISP 70% ISPRP (MISCELLANEOUS) ×1
BAG INFUSER PRESSURE 100CC (MISCELLANEOUS) IMPLANT
BLADE SURG SZ11 CARB STEEL (BLADE) ×3 IMPLANT
BNDG GAUZE 4.5X4.1 6PLY STRL (MISCELLANEOUS) ×3 IMPLANT
CANISTER SUCT 1200ML W/VALVE (MISCELLANEOUS) ×1 IMPLANT
CHLORAPREP W/TINT 26 (MISCELLANEOUS) ×3 IMPLANT
COVER TIP SHEARS 8 DVNC (MISCELLANEOUS) ×1 IMPLANT
COVER TIP SHEARS 8MM DA VINCI (MISCELLANEOUS) ×3
COVER WAND RF STERILE (DRAPES) ×6 IMPLANT
DEFOGGER SCOPE WARMER CLEARIFY (MISCELLANEOUS) ×3 IMPLANT
DERMABOND ADVANCED (GAUZE/BANDAGES/DRESSINGS) ×2
DERMABOND ADVANCED .7 DNX12 (GAUZE/BANDAGES/DRESSINGS) ×1 IMPLANT
DRAPE ARM DVNC X/XI (DISPOSABLE) ×3 IMPLANT
DRAPE COLUMN DVNC XI (DISPOSABLE) ×1 IMPLANT
DRAPE DA VINCI XI ARM (DISPOSABLE) ×9
DRAPE DA VINCI XI COLUMN (DISPOSABLE) ×3
ELECT CAUTERY BLADE 6.4 (BLADE) IMPLANT
ELECT REM PT RETURN 9FT ADLT (ELECTROSURGICAL) ×3
ELECTRODE REM PT RTRN 9FT ADLT (ELECTROSURGICAL) ×1 IMPLANT
GLOVE SURG SYN 6.5 ES PF (GLOVE) ×6 IMPLANT
GLOVE SURG SYN 6.5 PF PI (GLOVE) ×2 IMPLANT
GLOVE SURG UNDER POLY LF SZ7 (GLOVE) ×6 IMPLANT
GOWN STRL REUS W/ TWL LRG LVL3 (GOWN DISPOSABLE) ×3 IMPLANT
GOWN STRL REUS W/TWL LRG LVL3 (GOWN DISPOSABLE) ×9
IRRIGATOR SUCT 8 DISP DVNC XI (IRRIGATION / IRRIGATOR) IMPLANT
IRRIGATOR SUCTION 8MM XI DISP (IRRIGATION / IRRIGATOR)
IV NS 1000ML (IV SOLUTION)
IV NS 1000ML BAXH (IV SOLUTION) IMPLANT
LABEL OR SOLS (LABEL) IMPLANT
MANIFOLD NEPTUNE II (INSTRUMENTS) ×3 IMPLANT
MESH 3DMAX 4X6 LT LRG (Mesh General) ×2 IMPLANT
MESH 3DMAX MID 4X6 LT LRG (Mesh General) IMPLANT
NDL INSUFFLATION 14GA 120MM (NEEDLE) ×1 IMPLANT
NEEDLE HYPO 22GX1.5 SAFETY (NEEDLE) ×3 IMPLANT
NEEDLE INSUFFLATION 14GA 120MM (NEEDLE) ×3 IMPLANT
OBTURATOR OPTICAL STANDARD 8MM (TROCAR) ×3
OBTURATOR OPTICAL STND 8 DVNC (TROCAR) ×1
OBTURATOR OPTICALSTD 8 DVNC (TROCAR) ×1 IMPLANT
PACK LAP CHOLECYSTECTOMY (MISCELLANEOUS) ×3 IMPLANT
PENCIL ELECTRO HAND CTR (MISCELLANEOUS) ×3 IMPLANT
SEAL CANN UNIV 5-8 DVNC XI (MISCELLANEOUS) ×3 IMPLANT
SEAL XI 5MM-8MM UNIVERSAL (MISCELLANEOUS) ×9
SET TUBE SMOKE EVAC HIGH FLOW (TUBING) ×3 IMPLANT
SOLUTION ELECTROLUBE (MISCELLANEOUS) ×3 IMPLANT
SUT MNCRL 4-0 (SUTURE) ×6
SUT MNCRL 4-0 27XMFL (SUTURE) ×2
SUT VIC AB 2-0 SH 27 (SUTURE) ×3
SUT VIC AB 2-0 SH 27XBRD (SUTURE) ×1 IMPLANT
SUT VLOC 90 6 CV-15 VIOLET (SUTURE) ×4 IMPLANT
SUT VLOC 90 6" CV-15 VIOLET (SUTURE) ×2
SUTURE MNCRL 4-0 27XMF (SUTURE) ×1 IMPLANT
SYR 30ML LL (SYRINGE) ×3 IMPLANT
TAPE TRANSPORE STRL 2 31045 (GAUZE/BANDAGES/DRESSINGS) ×3 IMPLANT

## 2021-04-15 NOTE — Progress Notes (Signed)
PATIENT HEARTRATE BRADY DOWN TO 39 PATIENT WAS ALERT AND ORIENTED. VITALS STABLE. AS SOON AS PATIENT WAKES UP GOES TO 60'S. DR. Doristine Devoid SAYS OKAY AS LONG AS IT GOES BACK UP WHEN AWAKE. PATIENT DENIES PAIN AT THIS TIME.

## 2021-04-15 NOTE — Transfer of Care (Signed)
Immediate Anesthesia Transfer of Care Note  Patient: Jacob Ryan  Procedure(s) Performed: XI ROBOTIC ASSISTED INGUINAL HERNIA REPAIR WITH MESH (Left: Groin)  Patient Location: PACU  Anesthesia Type:General  Level of Consciousness: awake, drowsy and patient cooperative  Airway & Oxygen Therapy: Patient Spontanous Breathing  Post-op Assessment: Report given to RN and Post -op Vital signs reviewed and stable  Post vital signs: Reviewed and stable  Last Vitals:  Vitals Value Taken Time  BP 144/84 04/15/21 1325  Temp 35.9 C 04/15/21 1325  Pulse 59 04/15/21 1328  Resp 7 04/15/21 1328  SpO2 100 % 04/15/21 1328  Vitals shown include unvalidated device data.  Last Pain:  Vitals:   04/15/21 1325  TempSrc:   PainSc: Asleep         Complications: No notable events documented.

## 2021-04-15 NOTE — Anesthesia Preprocedure Evaluation (Signed)
Anesthesia Evaluation  Patient identified by MRN, date of birth, ID band Patient awake    Reviewed: Allergy & Precautions, NPO status , Patient's Chart, lab work & pertinent test results  History of Anesthesia Complications Negative for: history of anesthetic complications  Airway Mallampati: II  TM Distance: >3 FB Neck ROM: Full    Dental no notable dental hx.    Pulmonary sleep apnea , neg COPD,    breath sounds clear to auscultation- rhonchi (-) wheezing      Cardiovascular Exercise Tolerance: Good hypertension, Pt. on medications (-) CAD, (-) Past MI, (-) Cardiac Stents and (-) CABG  Rhythm:Regular Rate:Normal - Systolic murmurs and - Diastolic murmurs    Neuro/Psych  Headaches, neg Seizures PSYCHIATRIC DISORDERS Anxiety Depression    GI/Hepatic Neg liver ROS, GERD  ,  Endo/Other  negative endocrine ROSneg diabetes  Renal/GU negative Renal ROS     Musculoskeletal negative musculoskeletal ROS (+)   Abdominal (+) - obese,   Peds  Hematology negative hematology ROS (+)   Anesthesia Other Findings Past Medical History: No date: Chronic headaches No date: Chronic neck and back pain No date: Hypertension No date: Insomnia 05/31/2016: OSA (obstructive sleep apnea) No date: Vision abnormalities   Reproductive/Obstetrics                             Anesthesia Physical Anesthesia Plan  ASA: 2  Anesthesia Plan: General   Post-op Pain Management:    Induction: Intravenous  PONV Risk Score and Plan: 1 and Ondansetron, Dexamethasone and Midazolam  Airway Management Planned: Oral ETT  Additional Equipment:   Intra-op Plan:   Post-operative Plan: Extubation in OR  Informed Consent: I have reviewed the patients History and Physical, chart, labs and discussed the procedure including the risks, benefits and alternatives for the proposed anesthesia with the patient or authorized  representative who has indicated his/her understanding and acceptance.     Dental advisory given  Plan Discussed with: CRNA and Anesthesiologist  Anesthesia Plan Comments:         Anesthesia Quick Evaluation

## 2021-04-15 NOTE — Anesthesia Postprocedure Evaluation (Signed)
Anesthesia Post Note  Patient: DEVION CHRISCOE  Procedure(s) Performed: XI ROBOTIC ASSISTED INGUINAL HERNIA REPAIR WITH MESH (Left: Groin)  Patient location during evaluation: PACU Anesthesia Type: General Level of consciousness: awake and alert and oriented Pain management: pain level controlled Vital Signs Assessment: post-procedure vital signs reviewed and stable Respiratory status: spontaneous breathing, nonlabored ventilation and respiratory function stable Cardiovascular status: blood pressure returned to baseline and stable Postop Assessment: no signs of nausea or vomiting Anesthetic complications: no   No notable events documented.   Last Vitals:  Vitals:   04/15/21 1425 04/15/21 1458  BP: 134/85 (!) 146/87  Pulse: (!) 55 (!) 55  Resp: 18 16  Temp: (!) 36 C   SpO2: 100% 100%    Last Pain:  Vitals:   04/15/21 1458  TempSrc:   PainSc: 6                  Khadejah Son

## 2021-04-15 NOTE — Discharge Instructions (Addendum)
Hernia repair, Care After This sheet gives you information about how to care for yourself after your procedure. Your health care provider may also give you more specific instructions. If you have problems or questions, contact your health care provider. What can I expect after the procedure? After your procedure, it is common to have the following: Pain in your abdomen, especially in the incision areas. You will be given medicine to control the pain. Tiredness. This is a normal part of the recovery process. Your energy level will return to normal over the next several weeks. Changes in your bowel movements, such as constipation or needing to go more often. Talk with your health care provider about how to manage this. Follow these instructions at home: Medicines  tylenol and advil as needed for discomfort.  Please alternate between the two every four hours as needed for pain.    Use narcotics, if prescribed, only when tylenol and motrin is not enough to control pain.  325-650mg every 8hrs to max of 3000mg/24hrs (including the 325mg in every norco dose) for the tylenol.    Advil up to 800mg per dose every 8hrs as needed for pain.   PLEASE RECORD NUMBER OF PILLS TAKEN UNTIL NEXT FOLLOW UP APPT.  THIS WILL HELP DETERMINE HOW READY YOU ARE TO BE RELEASED FROM ANY ACTIVITY RESTRICTIONS Do not drive or use heavy machinery while taking prescription pain medicine. Do not drink alcohol while taking prescription pain medicine.  Incision care    Follow instructions from your health care provider about how to take care of your incision areas. Make sure you: Keep your incisions clean and dry. Wash your hands with soap and water before and after applying medicine to the areas, and before and after changing your bandage (dressing). If soap and water are not available, use hand sanitizer. Change your dressing as told by your health care provider. Leave stitches (sutures), skin glue, or adhesive strips in  place. These skin closures may need to stay in place for 2 weeks or longer. If adhesive strip edges start to loosen and curl up, you may trim the loose edges. Do not remove adhesive strips completely unless your health care provider tells you to do that. Do not wear tight clothing over the incisions. Tight clothing may rub and irritate the incision areas, which may cause the incisions to open. Do not take baths, swim, or use a hot tub until your health care provider approves. OK TO SHOWER IN 24HRS.   Check your incision area every day for signs of infection. Check for: More redness, swelling, or pain. More fluid or blood. Warmth. Pus or a bad smell. Activity Avoid lifting anything that is heavier than 10 lb (4.5 kg) for 2 weeks or until your health care provider says it is okay. No pushing/pulling greater than 30lbs You may resume normal activities as told by your health care provider. Ask your health care provider what activities are safe for you. Take rest breaks during the day as needed. Eating and drinking Follow instructions from your health care provider about what you can eat after surgery. To prevent or treat constipation while you are taking prescription pain medicine, your health care provider may recommend that you: Drink enough fluid to keep your urine clear or pale yellow. Take over-the-counter or prescription medicines. Eat foods that are high in fiber, such as fresh fruits and vegetables, whole grains, and beans. Limit foods that are high in fat and processed sugars, such as fried and   sweet foods. General instructions Ask your health care provider when you will need an appointment to get your sutures or staples removed. Keep all follow-up visits as told by your health care provider. This is important. Contact a health care provider if: You have more redness, swelling, or pain around your incisions. You have more fluid or blood coming from the incisions. Your incisions feel  warm to the touch. You have pus or a bad smell coming from your incisions or your dressing. You have a fever. You have an incision that breaks open (edges not staying together) after sutures or staples have been removed. Get help right away if: You develop a rash. You have chest pain or difficulty breathing. You have pain or swelling in your legs. You feel light-headed or you faint. Your abdomen swells (becomes distended). You have nausea or vomiting. You have blood in your stool (feces). This information is not intended to replace advice given to you by your health care provider. Make sure you discuss any questions you have with your health care provider. Document Released: 04/28/2005 Document Revised: 06/28/2018 Document Reviewed: 07/10/2016 Elsevier Interactive Patient Education  2019 [place]   The drugs that you were given will stay in your system until tomorrow so for the next 24 hours you should not:  Drive an automobile Make any legal decisions Drink any alcoholic beverage   You may resume regular meals tomorrow.  Today it is better to start with liquids and gradually work up to solid foods.  You may eat anything you prefer, but it is better to start with liquids, then soup and crackers, and gradually work up to solid foods.   Please notify your doctor immediately if you have any unusual bleeding, trouble breathing, redness and pain at the surgery site, drainage, fever, or pain not relieved by medication.    Additional Instructions:        Please contact your physician with any problems or Same Day Surgery at [phone], Monday through Friday 6 am to 4 pm, or [place] at [facility] number at [phone].

## 2021-04-15 NOTE — Anesthesia Procedure Notes (Signed)
Procedure Name: Intubation Date/Time: 04/15/2021 12:14 PM Performed by: Lowry Bowl, CRNA Pre-anesthesia Checklist: Patient identified, Emergency Drugs available, Suction available and Patient being monitored Patient Re-evaluated:Patient Re-evaluated prior to induction Oxygen Delivery Method: Circle system utilized Preoxygenation: Pre-oxygenation with 100% oxygen Induction Type: IV induction Ventilation: Mask ventilation without difficulty Laryngoscope Size: McGraph and 4 Grade View: Grade I Tube type: Oral Tube size: 7.5 mm Number of attempts: 1 Airway Equipment and Method: Stylet and Oral airway Placement Confirmation: ETT inserted through vocal cords under direct vision, positive ETCO2 and breath sounds checked- equal and bilateral Secured at: 22 cm Tube secured with: Tape Dental Injury: Teeth and Oropharynx as per pre-operative assessment

## 2021-04-15 NOTE — Interval H&P Note (Signed)
History and Physical Interval Note:  04/15/2021 11:11 AM  [name]  has presented today for surgery, with the diagnosis of Non-recurrent bilateral inguinal hernia without obstruction or gangrene K40.20.  The various methods of treatment have been discussed with the patient and family. After consideration of risks, benefits and other options for treatment, the patient has consented to  Procedure(s): XI ROBOTIC [place] (N/A) as a surgical intervention.  The patient's history has been reviewed, patient examined, no change in status, stable for surgery.  I have reviewed the patient's chart and labs.  Questions were answered to the patient's satisfaction.     Alura Olveda Lysle Pearl

## 2021-04-15 NOTE — Op Note (Signed)
Preoperative diagnosis: left groin pain  Postoperative diagnosis: left inguinal Hernia  Procedure: Robotic assisted laparoscopic left inguinal hernia repair with mesh  Anesthesia: General  Surgeon: Dr. Lysle Pearl  Wound Classification: Clean  Specimen: none  Complications: None  Estimated Blood Loss: 50mL   Indications:  inguinal hernia. Repair was indicated to avoid complications of incarceration, obstruction and pain, and a prosthetic mesh repair was elected.  See H&P for further details.  Findings: 1. Vas Deferens and cord structures identified and preserved 2. Bard 3D max medium weight mesh used for repair 3. Adequate hemostasis achieved  Description of procedure: The patient was taken to the operating room. A time-out was completed verifying correct patient, procedure, site, positioning, and implant(s) and/or special equipment prior to beginning this procedure.  Area was prepped and draped in the usual sterile fashion. An incision was marked 20 cm above the pubic tubercle, slightly above the umbilicus  Scrotum wrapped in Kerlix roll.  Veress needle inserted at palmer's point.  Saline drop test noted to be positive with gradual increase in pressure after initiation of gas insufflation.  15 mm of pressure was achieved prior to removing the Veress needle and then placing a 8 mm port via the Optiview technique through the supraumbilical site.  Inspection of the area afterwards noted no injury to the surrounding organs during insertion of the needle and the port.  2 port sites were marked 8 cm to the lateral sides of the initial port, and a 8 mm robotic port was placed on the left side, another 8 mm robotic port on the right side under direct supervision.  Local anesthesia  infused to the preplanned incision sites prior to insertion of the port.  The [place] was then brought into the operative field and docked to the ports.  Examination of the abdominal cavity noted a left  direct inguinal hernia.  A peritoneal flap was created approximately 8cm cephalad to the defect by using scissors with electrocautery.  Dissection was carried down towards the pubic tubercle, developing the myopectineal orifice view.  Laterally the flap was carried towards the ASIS.  Small direct hernia defect was noted, which carefully dissected away from the adjacent tissues to be fully reduced out of hernia cavity.  Any bleeding was controlled with combination of electrocautery and manual pressure.    After confirming adequate dissection and the peritoneal reflection completely down and away from the cord structures, a Large Bard 3DMax medium weight mesh was placed within the anterior abdominal wall, secured in place using 2-0 Vicryl on an SH needle immediately above the pubic tubercle.  After noting proper placement of the mesh with the peritoneal reflection deep to it, the previously created peritoneal flap was secured back up to the anterior abdominal wall using running 3-0 V-Lock.  Both needles were then removed out of the abdominal cavity, Xi platform undocked from the ports and removed off of operative field.  exparel infused as ilioinguinal block.  Abdomen then desufflated and ports removed. All the skin incisions were then closed with a subcuticular stitch of Monocryl 4-0. Dermabond was applied. The testis was gently pulled down into its anatomic position in the scrotum.  The patient tolerated the procedure well and was taken to the postanesthesia care unit in stable condition. Sponge and instrument count correct at end of procedure.

## 2021-04-18 ENCOUNTER — Other Ambulatory Visit: Payer: Self-pay

## 2021-04-18 ENCOUNTER — Ambulatory Visit (INDEPENDENT_AMBULATORY_CARE_PROVIDER_SITE_OTHER): Payer: 59 | Admitting: Pulmonary Disease

## 2021-04-18 ENCOUNTER — Encounter: Payer: Self-pay | Admitting: Surgery

## 2021-04-18 VITALS — Wt 172.0 lb

## 2021-04-18 DIAGNOSIS — G4700 Insomnia, unspecified: Secondary | ICD-10-CM

## 2021-04-18 MED ORDER — ZOLPIDEM TARTRATE 10 MG PO TABS: 10.0000 mg | ORAL_TABLET | Freq: Every evening | ORAL | 5 refills | Status: DC | PRN

## 2021-04-18 MED ORDER — GABAPENTIN 300 MG PO CAPS: 600.0000 mg | ORAL_CAPSULE | Freq: Every day | ORAL | 5 refills | Status: DC

## 2021-04-18 MED ORDER — ZOLPIDEM TARTRATE ER 12.5 MG PO TBCR: 12.5000 mg | EXTENDED_RELEASE_TABLET | Freq: Every day | ORAL | 5 refills | Status: DC

## 2021-04-18 NOTE — Patient Instructions (Signed)
Follow up in 1 year.

## 2021-04-18 NOTE — Progress Notes (Signed)
[facility]  Chief Complaint  Patient presents with   Follow-up    Insomnia.  Needs refills of the gabapentin and ambien    Constitutional:  Wt 172 lb (78 kg)   BMI 23.33 kg/m   Past Medical History:  Headaches, Neck pain, HTN, COVID 19 infection in November 2020 and May 2022  Past Surgical History:  He  has a past surgical history that includes Nasal sinus surgery (2017); Vasectomy (1996); Elbow surgery (Right); Upper gi endoscopy; LASIK (Bilateral, 2001); and XI Robotic assisted inguinal hernia repair with mesh (Left, 04/15/2021).  Brief Summary:  Jacob Ryan is a 50 y.o. 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  He has hx of OSA, but improved with weight loss and was intolerant of CPAP.      Subjective:  Virtual Visit via Telephone Note  I connected with Jacob Ryan on 04/18/21 at  1:45 PM EDT by telephone and verified that I am speaking with the correct person using two identifiers.  Location: Patient: Home Provider: Medical office   I discussed the limitations, risks, security and privacy concerns of performing an evaluation and management service by telephone and the availability of in person appointments. I also discussed with the patient that there may be a patient responsible charge related to this service. The patient expressed understanding and agreed to proceed.  Sleeping pattern stable.  Takes ambien CR, neurontin, and melatonin 30 minutes before going to bed.  Needs to use ambien 10 mg about 2 or 3 times per week when he wakes up and has trouble falling back to sleep.  Had COVID twice.  After 2nd episodes has noticed trouble keeping his weight up and feels like his muscle strength has gotten worse.  Physical Exam:     Sleep Tests:  PSG 05/15/12 >> AHI 3, SpO2 low 86%, PLMI 27.2, UARS PSG 05/20/16 >> AHI 12.7, SpO2 low 79%, PLMI 30.16 Auto CPAP 01/27/17 to 02/25/17 >> used on 17 of 30  nights with average 4 hrs 32 min.  Average AHI 3.5 with median CPAP 8 and 95 th percentile CPAP 11 cm H2O  Social History:  He  reports that he has never smoked. He has never used smokeless tobacco. He reports that he does not drink alcohol and does not use drugs.  Family History:  His family history includes Healthy in his sister; Lymphoma in his mother; Parkinsonism in his father.     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 - continue ambien CR, melatonin and gabapentin 300 mg nightly 30 minutes before bedtime - he uses 10 mg ambien prn when he has trouble falling back to sleep; discussed option of sublingual ambien also  Weight loss with history of COVID 19 infection. - advised him to d/w his PCP  Time Spent Involved in Patient Care on Day of Examination:    I discussed the assessment and treatment plan with the patient. The patient was provided an opportunity to ask questions and all were answered. The patient agreed with the plan and demonstrated an understanding of the instructions.   The patient was advised to call back or seek an in-person evaluation if the symptoms worsen or if the condition fails to improve as anticipated.  I provided 16 minutes of non-face-to-face time during this encounter.   Follow up:   Patient Instructions  Follow up in 1 year  Medication List:   Allergies as of  04/18/2021       Reactions   Amoxicillin Hives   Other    Feathers - sneezing   Pollen Extract    Latex Rash        Medication List        Accurate as of April 18, 2021  5:30 PM. If you have any questions, ask your nurse or doctor.          STOP taking these medications    doxycycline 100 MG capsule Commonly known as: VIBRAMYCIN Stopped by: [name], MD       TAKE these medications    acetaminophen 325 MG tablet Commonly known as: Tylenol Take 2 tablets (650 mg total) by mouth every 8 (eight) hours as needed for mild pain.    BERBERINE COMPLEX PO Take 1 capsule by mouth daily.   cholecalciferol 25 MCG (1000 UNIT) tablet Commonly known as: VITAMIN D3 Take 3,000 Units by mouth daily.   CINNAMON PO Take 1 capsule by mouth daily.   docusate sodium 100 MG capsule Commonly known as: Colace Take 1 capsule (100 mg total) by mouth 2 (two) times daily as needed for up to 10 days for mild constipation.   gabapentin 300 MG capsule Commonly known as: NEURONTIN Take 2 capsules (600 mg total) by mouth at bedtime.   Glucosamine-Chondroitin Tabs Take 2 tablets by mouth daily.   HYDROcodone-acetaminophen 5-325 MG tablet Commonly known as: Norco Take 1 tablet by mouth every 6 (six) hours as needed for up to 6 doses for moderate pain.   ibuprofen 800 MG tablet Commonly known as: ADVIL Take 1 tablet (800 mg total) by mouth every 8 (eight) hours as needed for mild pain or moderate pain.   lisinopril 20 MG tablet Commonly known as: ZESTRIL Take 20 mg by mouth daily.   Multi-Vitamins Tabs Take 1 tablet by mouth daily.   Omega-3 1000 MG Caps Take 2 capsules by mouth daily.   vitamin C 500 MG tablet Commonly known as: ASCORBIC ACID Take 500 mg by mouth daily.   zolpidem 12.5 MG CR tablet Commonly known as: AMBIEN CR Take 1 tablet (12.5 mg total) by mouth at bedtime. What changed: Another medication with the same name was added. Make sure you understand how and when to take each. Changed by: [name], MD   zolpidem 10 MG tablet Commonly known as: AMBIEN Take 1 tablet (10 mg total) by mouth at bedtime as needed for sleep. What changed: You were already taking a medication with the same name, and this prescription was added. Make sure you understand how and when to take each. Changed by: [name], MD        Signature:  [name], MD [place] Pager - [phone] 04/18/2021, 5:30 PM

## 2021-06-16 ENCOUNTER — Telehealth: Payer: Self-pay | Admitting: Pulmonary Disease

## 2021-06-16 NOTE — Telephone Encounter (Signed)
Call made to patient, confirmed DOB. He states he is leaving to go on vacation tomorrow for 10 days. He is asking that we contact pharmacy so that he can have his ambien filled early prior to him leaving. He does not need refills just needs [place] to contact his pharmacy to give verbal permission to fill order early.   VS please advise. Thanks :)

## 2021-06-16 NOTE — Telephone Encounter (Signed)
Okay to have contact pharmacy so he can get refill early.

## 2021-06-16 NOTE — Telephone Encounter (Signed)
I called and spoke with Cristie Hem at [facility] in [place] and gave the verbal order to fill the Ambien early. Alex verbalized understanding, nothing further needed.

## 2021-09-26 ENCOUNTER — Encounter: Payer: Self-pay | Admitting: Pulmonary Disease

## 2021-09-27 MED ORDER — ZOLPIDEM TARTRATE 10 MG PO TABS: 10.0000 mg | ORAL_TABLET | Freq: Every evening | ORAL | 5 refills | Status: DC | PRN

## 2021-09-27 MED ORDER — ZOLPIDEM TARTRATE ER 12.5 MG PO TBCR: 12.5000 mg | EXTENDED_RELEASE_TABLET | Freq: Every day | ORAL | 5 refills | Status: DC

## 2021-09-27 MED ORDER — GABAPENTIN 300 MG PO CAPS: 600.0000 mg | ORAL_CAPSULE | Freq: Every day | ORAL | 5 refills | Status: DC

## 2021-09-27 NOTE — Telephone Encounter (Signed)
Dr. Halford Chessman, please see mychart message from pt and advise: [facility] (supporting [name], MD) 17 hours ago (4:54 PM)   Dr Halford Chessman , Jacob Ryan here . Aug 16, 1971 . I just picked up my last refills for the gabapentin and my 2 zolpidem .If you could send in for the next 6 refills so they will be available when needed I would appreciate it .  [facility] [place] . Same as before .Thank you ,

## 2021-09-27 NOTE — Telephone Encounter (Signed)
Please let him know refills have been sent for gabapentin and two types of ambien.

## 2022-02-07 ENCOUNTER — Ambulatory Visit: Admit: 2022-02-07 | Payer: Self-pay

## 2022-02-07 SURGERY — COLONOSCOPY WITH PROPOFOL
Anesthesia: General

## 2022-02-14 ENCOUNTER — Telehealth: Payer: Self-pay | Admitting: Gastroenterology

## 2022-02-14 NOTE — Telephone Encounter (Signed)
Happy to take care of this for him. If this is his first colonoscopy, for screening purposes, can direct book with me at the [facility] if he meets criteria. Thanks ?

## 2022-02-14 NOTE — Telephone Encounter (Signed)
Good Afternoon Dr. Havery Moros, ? ?Patient called wanting to schedule an appointment to have a colonoscopy. Patient was last seen with you in 2017 and recently went to [facility] to have colonoscopy. Patient stated they had to cancel procedure because his insurance was not in network with them. Patient is requesting to come back with our office to have procedure. Patients records from [facility] are in [place]. Will you please review and advise on scheduling? ? ? ?Thank you.    ?

## 2022-02-17 NOTE — Telephone Encounter (Signed)
LVM for patient to call back to schedule

## 2022-02-20 ENCOUNTER — Encounter: Payer: Self-pay | Admitting: Gastroenterology

## 2022-02-22 ENCOUNTER — Ambulatory Visit (INDEPENDENT_AMBULATORY_CARE_PROVIDER_SITE_OTHER): Payer: Commercial Managed Care - HMO | Admitting: Pulmonary Disease

## 2022-02-22 ENCOUNTER — Encounter: Payer: Self-pay | Admitting: Pulmonary Disease

## 2022-02-22 DIAGNOSIS — G4700 Insomnia, unspecified: Secondary | ICD-10-CM | POA: Diagnosis not present

## 2022-02-22 MED ORDER — GABAPENTIN 300 MG PO CAPS: 600.0000 mg | ORAL_CAPSULE | Freq: Every day | ORAL | 5 refills | Status: DC

## 2022-02-22 MED ORDER — ZOLPIDEM TARTRATE 10 MG PO TABS: 10.0000 mg | ORAL_TABLET | Freq: Every evening | ORAL | 5 refills | Status: DC | PRN

## 2022-02-22 MED ORDER — ZOLPIDEM TARTRATE ER 12.5 MG PO TBCR: 12.5000 mg | EXTENDED_RELEASE_TABLET | Freq: Every day | ORAL | 5 refills | Status: DC

## 2022-02-22 NOTE — Telephone Encounter (Signed)
Dr. Sood, please see mychart message sent by pt and advise. °

## 2022-02-22 NOTE — Patient Instructions (Signed)
Follow up in 1 year.

## 2022-02-22 NOTE — Progress Notes (Signed)
? ?[facility] ? ?Chief Complaint  ?Patient presents with  ? Follow-up  ?  Insomnia  ? ? ?Constitutional:  ?There were no vitals taken for this visit. ? ?Past Medical History:  ?Headaches, Neck pain, HTN, COVID 19 infection in November 2020 and May 2022 ? ?Past Surgical History:  ?He  has a past surgical history that includes Nasal sinus surgery (2017); Vasectomy (1996); Elbow surgery (Right); Upper gi endoscopy; LASIK (Bilateral, 2001); and XI Robotic assisted inguinal hernia repair with mesh (Left, 04/15/2021). ? ?Brief Summary:  ?Jacob Ryan is a 51 y.o. 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  He has hx of OSA, but improved with weight loss and was intolerant of CPAP. ?  ? ? ? ?Subjective:  ?Virtual Visit via Telephone Note ? ?I connected with Jacob Ryan on 02/22/22 at 11:30 AM EDT by telephone and verified that I am speaking with the correct person using two identifiers. ? ?Location: ?Patient: Home ?Provider: Medical office ?  ?I discussed the limitations, risks, security and privacy concerns of performing an evaluation and management service by telephone and the availability of in person appointments. I also discussed with the patient that there may be a patient responsible charge related to this service. The patient expressed understanding and agreed to proceed. ? ?Sleep pattern stable.  Takes ambien CR, neurotin, melatonin 30 minutes before going to sleep.  Uses ambien 10 mg 2 or 3 times per week, and uses when he wakes up and can't fall back to sleep.  Has stress from work, but manageable.  Has trouble with remembering sometimes, but feels this is related to stress and being busy with work. ? ? ?Physical Exam:  ? ?  ?Sleep Tests:  ?PSG 05/15/12 >> AHI 3, SpO2 low 86%, PLMI 27.2, UARS ?PSG 05/20/16 >> AHI 12.7, SpO2 low 79%, PLMI 30.16 ?Auto CPAP 01/27/17 to 02/25/17 >> used on 17 of 30 nights with average 4 hrs 32 min.  Average AHI 3.5  with median CPAP 8 and 95 th percentile CPAP 11 cm H2O ? ?Social History:  ?He  reports that he has never smoked. He has never used smokeless tobacco. He reports that he does not drink alcohol and does not use drugs. ? ?Family History:  ?His family history includes Healthy in his sister; Lymphoma in his mother; Parkinsonism in his father. ?  ? ? ?Assessment/Plan:  ? ?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 ?- continue ambien CR, melatonin and gabapentin 300 mg nightly 30 minutes before bedtime ?- he uses 10 mg ambien prn when he has trouble falling back to sleep; discussed option of sublingual Lorrin Mais also ? ? ?Time Spent Involved in Patient Care on Day of Examination:  ?  ?I discussed the assessment and treatment plan with the patient. The patient was provided an opportunity to ask questions and all were answered. The patient agreed with the plan and demonstrated an understanding of the instructions. ?  ?The patient was advised to call back or seek an in-person evaluation if the symptoms worsen or if the condition fails to improve as anticipated. ? ?I provided 15 minutes of non-face-to-face time during this encounter. ? ? ?Follow up:  ? ?Patient Instructions  ?Follow up in 1 year ? ?Medication List:  ? ?Allergies as of 02/22/2022   ? ?   Reactions  ? Amoxicillin Hives  ? Other   ? Feathers - sneezing  ? Pollen Extract   ?  Latex Rash  ? ?  ? ?  ?Medication List  ?  ? ?  ? Accurate as of Feb 22, 2022 12:52 PM. If you have any questions, ask your nurse or doctor.  ?  ?  ? ?  ? ?STOP taking these medications   ? ?HYDROcodone-acetaminophen 5-325 MG tablet ?Commonly known as: Norco ?Stopped by: [name], MD ?  ? ?  ? ?TAKE these medications   ? ?BERBERINE COMPLEX PO ?Take 1 capsule by mouth daily. ?  ?cholecalciferol 25 MCG (1000 UNIT) tablet ?Commonly known as: VITAMIN D3 ?Take 3,000 Units by mouth daily. ?  ?CINNAMON PO ?Take 1 capsule by mouth daily. ?  ?gabapentin 300 MG capsule ?Commonly known as:  NEURONTIN ?Take 2 capsules (600 mg total) by mouth at bedtime. ?  ?Glucosamine-Chondroitin Tabs ?Take 2 tablets by mouth daily. ?  ?ibuprofen 800 MG tablet ?Commonly known as: ADVIL ?Take 1 tablet (800 mg total) by mouth every 8 (eight) hours as needed for mild pain or moderate pain. ?  ?lisinopril 20 MG tablet ?Commonly known as: ZESTRIL ?Take 20 mg by mouth daily. ?  ?Multi-Vitamins Tabs ?Take 1 tablet by mouth daily. ?  ?Omega-3 1000 MG Caps ?Take 2 capsules by mouth daily. ?  ?vitamin C 500 MG tablet ?Commonly known as: ASCORBIC ACID ?Take 500 mg by mouth daily. ?  ?zolpidem 10 MG tablet ?Commonly known as: AMBIEN ?Take 1 tablet (10 mg total) by mouth at bedtime as needed for sleep. ?  ?zolpidem 12.5 MG CR tablet ?Commonly known as: AMBIEN CR ?Take 1 tablet (12.5 mg total) by mouth at bedtime. ?  ? ?  ? ? ?Signature:  ?[name], MD ?[place] ?Pager - [phone] - [phone] ?02/22/2022, 12:52 PM ?  ? ? ? ? ? ? ? ? ?

## 2022-03-17 ENCOUNTER — Encounter: Payer: Self-pay | Admitting: Pulmonary Disease

## 2022-03-17 DIAGNOSIS — G4700 Insomnia, unspecified: Secondary | ICD-10-CM

## 2022-03-17 MED ORDER — ZOLPIDEM TARTRATE 10 MG PO TABS: 10.0000 mg | ORAL_TABLET | Freq: Every evening | ORAL | 0 refills | Status: DC | PRN

## 2022-03-17 MED ORDER — ZOLPIDEM TARTRATE ER 12.5 MG PO TBCR: 12.5000 mg | EXTENDED_RELEASE_TABLET | Freq: Every day | ORAL | 0 refills | Status: DC

## 2022-03-17 NOTE — Telephone Encounter (Signed)
Ambien refilled. Gabapentin has 5 refills on it so he should just have to contact his pharmacy and ask them to refill it. Thanks.

## 2022-03-22 ENCOUNTER — Ambulatory Visit (AMBULATORY_SURGERY_CENTER): Payer: Self-pay | Admitting: *Deleted

## 2022-03-22 VITALS — Ht 71.25 in | Wt 172.0 lb

## 2022-03-22 DIAGNOSIS — Z1211 Encounter for screening for malignant neoplasm of colon: Secondary | ICD-10-CM

## 2022-03-22 MED ORDER — NA SULFATE-K SULFATE-MG SULF 17.5-3.13-1.6 GM/177ML PO SOLN: 1.0000 | ORAL | 0 refills | Status: DC

## 2022-03-22 NOTE — Progress Notes (Signed)
Patient's pre-visit was done today over the phone with the patient. Name,DOB and address verified. Patient denies any allergies to Eggs and Soy. Patient denies any problems with anesthesia/sedation. Patient is not taking any diet pills or blood thinners. No home Oxygen. Insurance confirmed with patient.  Prep instructions sent to pt's MyChart or mailed to pt-pt is aware. Patient understands to call us back with any questions or concerns. Patient is aware of our care-partner policy. Pt will use good rx for suprep rx.  EMMI education assigned to the patient for the procedure, sent to [place].

## 2022-03-29 ENCOUNTER — Encounter: Payer: Self-pay | Admitting: Certified Registered Nurse Anesthetist

## 2022-04-03 ENCOUNTER — Encounter: Payer: Self-pay | Admitting: Gastroenterology

## 2022-04-05 ENCOUNTER — Encounter: Payer: Self-pay | Admitting: Gastroenterology

## 2022-04-05 ENCOUNTER — Ambulatory Visit (AMBULATORY_SURGERY_CENTER): Payer: Commercial Managed Care - HMO | Admitting: Gastroenterology

## 2022-04-05 VITALS — BP 117/69 | HR 72 | Temp 98.0°F | Resp 16 | Ht 71.25 in | Wt 172.0 lb

## 2022-04-05 DIAGNOSIS — Z1211 Encounter for screening for malignant neoplasm of colon: Secondary | ICD-10-CM

## 2022-04-05 DIAGNOSIS — D128 Benign neoplasm of rectum: Secondary | ICD-10-CM | POA: Diagnosis not present

## 2022-04-05 DIAGNOSIS — D127 Benign neoplasm of rectosigmoid junction: Secondary | ICD-10-CM

## 2022-04-05 DIAGNOSIS — D125 Benign neoplasm of sigmoid colon: Secondary | ICD-10-CM

## 2022-04-05 MED ORDER — SODIUM CHLORIDE 0.9 % IV SOLN: 500.0000 mL | Freq: Once | INTRAVENOUS | Status: DC

## 2022-04-05 NOTE — Progress Notes (Signed)
Pt's states no medical or surgical changes since previsit or office visit. 

## 2022-04-05 NOTE — Progress Notes (Signed)
1410 Ephedrine 10 mg given IV due to low BP, MD updated.  

## 2022-04-05 NOTE — Patient Instructions (Signed)
YOU HAD AN ENDOSCOPIC PROCEDURE TODAY AT [facility]:   Refer to the procedure report that was given to you for any specific questions about what was found during the examination.  If the procedure report does not answer your questions, please call your gastroenterologist to clarify.  If you requested that your care partner not be given the details of your procedure findings, then the procedure report has been included in a sealed envelope for you to review at your convenience later.  YOU SHOULD EXPECT: Some feelings of bloating in the abdomen. Passage of more gas than usual.  Walking can help get rid of the air that was put into your GI tract during the procedure and reduce the bloating. If you had a lower endoscopy (such as a colonoscopy or flexible sigmoidoscopy) you may notice spotting of blood in your stool or on the toilet paper. If you underwent a bowel prep for your procedure, you may not have a normal bowel movement for a few days.  Please Note:  You might notice some irritation and congestion in your nose or some drainage.  This is from the oxygen used during your procedure.  There is no need for concern and it should clear up in a day or so.  SYMPTOMS TO REPORT IMMEDIATELY:  Following lower endoscopy (colonoscopy or flexible sigmoidoscopy):  Excessive amounts of blood in the stool  Significant tenderness or worsening of abdominal pains  Swelling of the abdomen that is new, acute  Fever of 100F or higher  Following upper endoscopy (EGD)  Vomiting of blood or coffee ground material  New chest pain or pain under the shoulder blades  Painful or persistently difficult swallowing  New shortness of breath  Fever of 100F or higher  Black, tarry-looking stools  For urgent or emergent issues, a gastroenterologist can be reached at any hour by calling [phone]. Do not use MyChart messaging for urgent concerns.    DIET:  We do recommend a small meal at first, but  then you may proceed to your regular diet.  Drink plenty of fluids but you should avoid alcoholic beverages for 24 hours.  ACTIVITY:  You should plan to take it easy for the rest of today and you should NOT DRIVE or use heavy machinery until tomorrow (because of the sedation medicines used during the test).    FOLLOW UP: Our staff will call the number listed on your records 24-72 hours following your procedure to check on you and address any questions or concerns that you may have regarding the information given to you following your procedure. If we do not reach you, we will leave a message.  We will attempt to reach you two times.  During this call, we will ask if you have developed any symptoms of COVID 19. If you develop any symptoms (ie: fever, flu-like symptoms, shortness of breath, cough etc.) before then, please call [phone].  If you test positive for Covid 19 in the 2 weeks post procedure, please call and report this information to [place].    If any biopsies were taken you will be contacted by phone or by letter within the next 1-3 weeks.  Please call us at [phone] if you have not heard about the biopsies in 3 weeks.    SIGNATURES/CONFIDENTIALITY: You and/or your care partner have signed paperwork which will be entered into your electronic medical record.  These signatures attest to the fact that that the information above on your After  Visit Summary has been reviewed and is understood.  Full responsibility of the confidentiality of this discharge information lies with you and/or your care-partner.

## 2022-04-05 NOTE — Op Note (Signed)
[facility] Patient Name: Jacob Ryan Procedure Date: 04/05/2022 1:50 PM MRN: [id number] Endoscopist: Remo Lipps P. Havery Moros , MD Age: 51 Referring MD:  Date of Birth: [birth year] Gender: Male Account #: [id number] Procedure:                Colonoscopy Indications:              Screening for colorectal malignant neoplasm, This                            is the patient's first colonoscopy Medicines:                Monitored Anesthesia Care Procedure:                Pre-Anesthesia Assessment:                           - Prior to the procedure, a History and Physical                            was performed, and patient medications and                            allergies were reviewed. The patient's tolerance of                            previous anesthesia was also reviewed. The risks                            and benefits of the procedure and the sedation                            options and risks were discussed with the patient.                            All questions were answered, and informed consent                            was obtained. Prior Anticoagulants: The patient has                            taken no previous anticoagulant or antiplatelet                            agents. ASA Grade Assessment: II - A patient with                            mild systemic disease. After reviewing the risks                            and benefits, the patient was deemed in                            satisfactory condition to undergo the procedure.  After obtaining informed consent, the colonoscope                            was passed under direct vision. Throughout the                            procedure, the patient's blood pressure, pulse, and                            oxygen saturations were monitored continuously. The                            PCF-HQ190L Colonoscope was introduced through the                            anus and advanced to  the the cecum, identified by                            appendiceal orifice and ileocecal valve. The                            colonoscopy was performed without difficulty. The                            patient tolerated the procedure well. The quality                            of the bowel preparation was good. The ileocecal                            valve, appendiceal orifice, and rectum were                            photographed. Scope In: 2:08:48 PM Scope Out: 2:28:24 PM Scope Withdrawal Time: 0 hours 15 minutes 58 seconds  Total Procedure Duration: 0 hours 19 minutes 36 seconds  Findings:                 The perianal and digital rectal examinations were                            normal.                           A 4 mm polyp was found in the sigmoid colon. The                            polyp was sessile. The polyp was removed with a                            cold snare. Resection and retrieval were complete.                           Three sessile polyps were found in the  recto-sigmoid colon. The polyps were 2 to 3 mm in                            size. These polyps were removed with a cold snare.                            Resection and retrieval were complete.                           A diminutive polyp was found in the distal rectum.                            The polyp was sessile. The polyp was removed with a                            cold snare. Resection and retrieval were complete.                           Internal hemorrhoids were found during retroflexion.                           The exam was otherwise without abnormality. Complications:            No immediate complications. Estimated blood loss:                            Minimal. Estimated Blood Loss:     Estimated blood loss was minimal. Impression:               - One 4 mm polyp in the sigmoid colon, removed with                            a cold snare. Resected and  retrieved.                           - Three 2 to 3 mm polyps at the recto-sigmoid                            colon, removed with a cold snare. Resected and                            retrieved.                           - One diminutive polyp in the distal rectum,                            removed with a cold snare. Resected and retrieved.                           - Internal hemorrhoids.                           - The examination was otherwise normal. Recommendation:           -  Patient has a contact number available for                            emergencies. The signs and symptoms of potential                            delayed complications were discussed with the                            patient. Return to normal activities tomorrow.                            Written discharge instructions were provided to the                            patient.                           - Resume previous diet.                           - Continue present medications.                           - Await pathology results. Remo Lipps P. Deshon Koslowski, MD 04/05/2022 2:32:37 PM This report has been signed electronically.

## 2022-04-05 NOTE — Progress Notes (Signed)
Report given to PACU, vss 

## 2022-04-05 NOTE — Progress Notes (Signed)
[facility] History and Physical   Primary Care Physician:  Baxter Hire, MD   Reason for Procedure:   Colon cancer screening  Plan:    colonoscopy     HPI: Jacob Ryan is a 51 y.o. male  here for colonoscopy screening - first time exam. Patient denies any bowel symptoms at this time. No family history of colon cancer known. Otherwise feels well without any cardiopulmonary symptoms.    Past Medical History:  Diagnosis Date   Chronic headaches    Chronic neck and back pain    Hypertension    Insomnia    OSA (obstructive sleep apnea) 05/31/2016   does not use CPAP   Vision abnormalities     Past Surgical History:  Procedure Laterality Date   ELBOW SURGERY Right    tendon repair   LASIK Bilateral 2001   NASAL SINUS SURGERY  2017   UPPER GI ENDOSCOPY  11/17/2015   Dr.Maddoxx Burkitt   VASECTOMY  1996   XI ROBOTIC ASSISTED INGUINAL HERNIA REPAIR WITH MESH Left 04/15/2021   Procedure: XI ROBOTIC ASSISTED INGUINAL HERNIA REPAIR WITH MESH;  Surgeon: Benjamine Sprague, DO;  Location: [facility] ORS;  Service: General;  Laterality: Left;    Prior to Admission medications   Medication Sig Start Date End Date Taking? Authorizing Provider  cholecalciferol (VITAMIN D3) 25 MCG (1000 UNIT) tablet Take 3,000 Units by mouth daily.   Yes [provider]  gabapentin (NEURONTIN) 300 MG capsule Take 2 capsules (600 mg total) by mouth at bedtime. 02/22/22  Yes [name], MD  Glucosamine-Chondroit-Vit C-Mn (GLUCOSAMINE-CHONDROITIN) TABS Take 2 tablets by mouth daily.   Yes [provider]  ibuprofen (ADVIL) 200 MG tablet Take 200 mg by mouth every 6 (six) hours as needed.   Yes [provider]  lisinopril (PRINIVIL,ZESTRIL) 20 MG tablet Take 20 mg by mouth daily.   Yes [provider]  Multiple Vitamin (MULTI-VITAMINS) TABS Take 1 tablet by mouth daily.   Yes [provider]  Omega-3 1000 MG CAPS Take 2 capsules by mouth daily.   Yes [provider]  vitamin C (ASCORBIC ACID) 500 MG tablet Take 500 mg by mouth daily.   Yes [provider]  zolpidem (AMBIEN CR) 12.5 MG CR tablet Take 1 tablet (12.5 mg total) by mouth at bedtime. 03/17/22  Yes Cobb, Karie Schwalbe, NP    Current Outpatient Medications  Medication Sig Dispense Refill   cholecalciferol (VITAMIN D3) 25 MCG (1000 UNIT) tablet Take 3,000 Units by mouth daily.     gabapentin (NEURONTIN) 300 MG capsule Take 2 capsules (600 mg total) by mouth at bedtime. 60 capsule 5   Glucosamine-Chondroit-Vit C-Mn (GLUCOSAMINE-CHONDROITIN) TABS Take 2 tablets by mouth daily.     ibuprofen (ADVIL) 200 MG tablet Take 200 mg by mouth every 6 (six) hours as needed.     lisinopril (PRINIVIL,ZESTRIL) 20 MG tablet Take 20 mg by mouth daily.     Multiple Vitamin (MULTI-VITAMINS) TABS Take 1 tablet by mouth daily.     Omega-3 1000 MG CAPS Take 2 capsules by mouth daily.     vitamin C (ASCORBIC ACID) 500 MG tablet Take 500 mg by mouth daily.     zolpidem (AMBIEN CR) 12.5 MG CR tablet Take 1 tablet (12.5 mg total) by mouth at bedtime. 30 tablet 0   Current Facility-Administered Medications  Medication Dose Route Frequency Provider Last Rate Last Admin   0.9 %  sodium chloride infusion  500 mL Intravenous Once  Yetta Flock, MD        Allergies as of 04/05/2022 - Review Complete 04/05/2022  Allergen Reaction Noted   Amoxicillin Hives 05/26/2013   Other  11/02/2015   Pollen extract  06/30/2015   Latex Rash 06/30/2015    Family History  Problem Relation Age of Onset   Lymphoma Mother    Parkinsonism Father    Healthy Sister    Colon cancer Neg Hx    Esophageal cancer Neg Hx    Stomach cancer Neg Hx    Rectal cancer Neg Hx     Social History   Socioeconomic History   Marital status: Divorced    Spouse name: Not on file   Number of children: Not on file   Years of education: Not on file   Highest education level: Not on file  Occupational History   Not on  file  Tobacco Use   Smoking status: Never   Smokeless tobacco: Never  Vaping Use   Vaping Use: Never used  Substance and Sexual Activity   Alcohol use: No   Drug use: No   Sexual activity: Not on file  Other Topics Concern   Not on file  Social History Narrative   Not on file   Social Determinants of Health   Financial Resource Strain: Not on file  Food Insecurity: Not on file  Transportation Needs: Not on file  Physical Activity: Not on file  Stress: Not on file  Social Connections: Not on file  Intimate Partner Violence: Not on file    Review of Systems: All other review of systems negative except as mentioned in the HPI.  Physical Exam: Vital signs BP 124/79   Pulse (!) 51   Temp 98 F (36.7 C)   Resp 10   Ht 5' 11.25" (1.81 m)   Wt 172 lb (78 kg)   SpO2 99%   BMI 23.82 kg/m   General:   Alert,  Well-developed, pleasant and cooperative in NAD Lungs:  Clear throughout to auscultation.   Heart:  Regular rate and rhythm Abdomen:  Soft, nontender and nondistended.   Neuro/Psych:  Alert and cooperative. Normal mood and affect. A and O x 3  Jolly Mango, MD [facility]

## 2022-04-05 NOTE — Progress Notes (Signed)
Called to room to assist during endoscopic procedure.  Patient ID and intended procedure confirmed with present staff. Received instructions for my participation in the procedure from the performing physician.  

## 2022-04-06 ENCOUNTER — Telehealth: Payer: Self-pay | Admitting: *Deleted

## 2022-04-06 NOTE — Telephone Encounter (Signed)
No answer on first attempt follow up call. Left message.  ?

## 2022-05-08 ENCOUNTER — Encounter: Payer: Self-pay | Admitting: Pulmonary Disease

## 2022-05-08 MED ORDER — AZITHROMYCIN 250 MG PO TABS: 250.0000 mg | ORAL_TABLET | Freq: Every day | ORAL | 0 refills | Status: DC

## 2022-05-08 NOTE — Telephone Encounter (Signed)
Can send a script for zpak.  If his symptoms don't improve, then he needs to schedule an in person visit with me or one of the NPs.

## 2022-06-19 ENCOUNTER — Encounter: Payer: Self-pay | Admitting: Gastroenterology

## 2022-08-28 ENCOUNTER — Encounter (HOSPITAL_BASED_OUTPATIENT_CLINIC_OR_DEPARTMENT_OTHER): Payer: Self-pay | Admitting: Pulmonary Disease

## 2022-08-28 ENCOUNTER — Other Ambulatory Visit: Payer: Self-pay | Admitting: Pulmonary Disease

## 2022-08-28 DIAGNOSIS — G4700 Insomnia, unspecified: Secondary | ICD-10-CM

## 2022-08-29 NOTE — Telephone Encounter (Signed)
there is Gabapentin , and 2 different zolpidem  . one is zolpidem 12.5 control release and the other is zolpidem 10 regular for use sometimes as needed . thanks   Dr. Halford Chessman please advise, patient is asking for a refill on gabapentin and zolpidem.

## 2022-08-30 MED ORDER — ZOLPIDEM TARTRATE 10 MG PO TABS: 10.0000 mg | ORAL_TABLET | Freq: Every evening | ORAL | 5 refills | Status: DC | PRN

## 2022-08-30 MED ORDER — GABAPENTIN 300 MG PO CAPS
600.0000 mg | ORAL_CAPSULE | Freq: Every day | ORAL | 5 refills | Status: DC
Start: 2022-08-30 — End: 2023-02-13

## 2022-08-30 MED ORDER — ZOLPIDEM TARTRATE ER 12.5 MG PO TBCR: 12.5000 mg | EXTENDED_RELEASE_TABLET | Freq: Every day | ORAL | 5 refills | Status: DC

## 2022-08-30 NOTE — Telephone Encounter (Signed)
Refills for two different zolpidems and for gabapentin sent.

## 2022-08-30 NOTE — Telephone Encounter (Signed)
Thanks Dr. Halford Chessman! Sorry about that!  I have called and left a voicemail for [facility] [address] To cancel the [place] rx

## 2022-08-30 NOTE — Addendum Note (Signed)
Addended by: [name] on: 08/30/2022 01:28 PM   Modules accepted: Orders

## 2022-08-30 NOTE — Telephone Encounter (Signed)
the zolpidem 12.5 CR went to the wrong pharmacy . It somehow went to [facility] [address] . we discussed the [place] for where to send them . do you know what happened ?    Dr. Halford Chessman please advise, can [facility] [place] on [address]?

## 2022-08-30 NOTE — Telephone Encounter (Signed)
There were two separate messages that came through for his refills.  The zolpidem 12.5 mg came in first and I sent that in.  I then saw the message about the other medications.  Didn't realize they were different pharmacies listed in the different messages.  I have [facility] [address]  Can you cancel the script sent to [facility] [address]

## 2022-11-28 ENCOUNTER — Encounter: Payer: Self-pay | Admitting: Pulmonary Disease

## 2022-11-28 NOTE — Telephone Encounter (Signed)
ohn D Ryan  P [facility] (supporting [name], MD)6 minutes ago (3:56 PM)    Good afternoon .I called to ask the front desk a question but was on hold for about 45 minutes before I had to hang up . I wanted to touch base with you and see if you accepted Medicaid ? And , I was told by my primary not to select [place] health [facility] . that they were not good to deal with . So I thought I would ask you about [employer] medicaid . they seem to have good benefits but so did [place] . My primary did not have any knowledge of [employer] . I was just curious what you thought of them . I hated to message you about this but could not wait on hold any more . I want to make the right choice on deciding which company to go with . I am just asking for your opinion .Thanks for the info .   Can you guys touch base with this patient? I would help but have no clue about insurance companies and what we do/ do not accept. Thanks!

## 2023-01-29 ENCOUNTER — Encounter (HOSPITAL_BASED_OUTPATIENT_CLINIC_OR_DEPARTMENT_OTHER): Payer: Self-pay | Admitting: Pulmonary Disease

## 2023-01-30 ENCOUNTER — Telehealth: Payer: Self-pay

## 2023-01-30 NOTE — Telephone Encounter (Signed)
Pharmacy, please advise. Pt's Ambien may need a PA. Rejection states there is a 15/month limit, script was written for 30/month.

## 2023-01-30 NOTE — Telephone Encounter (Signed)
PA received via CMM for Zolpidem Tartrate ER 12.5MG  er tablets  PA has been submitted to [facility] Medicaid and has been APPROVED from 01/30/2023-07/28/2023  Key: [id number]

## 2023-02-13 ENCOUNTER — Telehealth (HOSPITAL_BASED_OUTPATIENT_CLINIC_OR_DEPARTMENT_OTHER): Payer: Medicaid Other | Admitting: Pulmonary Disease

## 2023-02-13 ENCOUNTER — Encounter (HOSPITAL_BASED_OUTPATIENT_CLINIC_OR_DEPARTMENT_OTHER): Payer: Self-pay | Admitting: Pulmonary Disease

## 2023-02-13 DIAGNOSIS — G4700 Insomnia, unspecified: Secondary | ICD-10-CM | POA: Diagnosis not present

## 2023-02-13 MED ORDER — ZOLPIDEM TARTRATE 10 MG PO TABS: 10.0000 mg | ORAL_TABLET | Freq: Every evening | ORAL | 5 refills | Status: AC | PRN

## 2023-02-13 MED ORDER — GABAPENTIN 300 MG PO CAPS: 600.0000 mg | ORAL_CAPSULE | Freq: Every day | ORAL | 5 refills | Status: DC

## 2023-02-13 MED ORDER — ZOLPIDEM TARTRATE ER 12.5 MG PO TBCR: 12.5000 mg | EXTENDED_RELEASE_TABLET | Freq: Every day | ORAL | 5 refills | Status: DC

## 2023-02-13 NOTE — Progress Notes (Signed)
[facility]  Chief Complaint  Patient presents with   Follow-up    insomnia    Constitutional:  There were no vitals taken for this visit.  Past Medical History:  Headaches, Neck pain, HTN, COVID 19 infection in November 2020 and May 2022  Past Surgical History:  He  has a past surgical history that includes Nasal sinus surgery (2017); Vasectomy (1996); Elbow surgery (Right); Upper gi endoscopy (11/17/2015); LASIK (Bilateral, 2001); and XI Robotic assisted inguinal hernia repair with mesh (Left, 04/15/2021).  Brief Summary:  Jacob Ryan is a 52 y.o. 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  He has hx of OSA, but improved with weight loss and was intolerant of CPAP.      Subjective:  Virtual Visit via Telephone Note  I connected with Jacob Ryan on 02/13/23 at  1:00 PM EDT by telephone and verified that I am speaking with the correct person using two identifiers.  Location: Patient: Home Provider: Medical office   I discussed the limitations, risks, security and privacy concerns of performing an evaluation and management service by telephone and the availability of in person appointments. I also discussed with the patient that there may be a patient responsible charge related to this service. The patient expressed understanding and agreed to proceed.  He has been sleeping well.  Remains on consistent regimen.  Not as much stress at work.  He doesn't typically need to use the extra ambien during the night.  He is not using CPAP.  He is worried his girlfriend might have sleep apnea and had her try his CPAP for a few nights.   Physical Exam:  Speech fluent.   Sleep Tests:  PSG 05/15/12 >> AHI 3, SpO2 low 86%, PLMI 27.2, UARS PSG 05/20/16 >> AHI 12.7, SpO2 low 79%, PLMI 30.16 Auto CPAP 01/27/17 to 02/25/17 >> used on 17 of 30 nights with average 4 hrs 32 min.  Average AHI 3.5 with median CPAP 8 and 95 th  percentile CPAP 11 cm H2O  Social History:  He  reports that he has never smoked. He has never used smokeless tobacco. He reports that he does not drink alcohol and does not use drugs.  Family History:  His family history includes Healthy in his sister; Lymphoma in his mother; Parkinsonism in his father.     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 - continue ambien CR, melatonin and gabapentin 300 mg nightly 30 minutes before bedtime - he uses 10 mg ambien prn when he has trouble falling back to sleep; discussed option of sublingual ambien also   Time Spent Involved in Patient Care on Day of Examination:    I discussed the assessment and treatment plan with the patient. The patient was provided an opportunity to ask questions and all were answered. The patient agreed with the plan and demonstrated an understanding of the instructions.   The patient was advised to call back or seek an in-person evaluation if the symptoms worsen or if the condition fails to improve as anticipated.  I provided 16 minutes of non-face-to-face time during this encounter.   Follow up:   Patient Instructions  Follow up in 1 year  Medication List:   Allergies as of 02/13/2023       Reactions   Amoxicillin Hives   Other    Feathers - sneezing   Pollen Extract    Latex Rash  Medication List        Accurate as of February 13, 2023  1:39 PM. If you have any questions, ask your nurse or doctor.          ascorbic acid 500 MG tablet Commonly known as: VITAMIN C Take 500 mg by mouth daily.   cholecalciferol 25 MCG (1000 UNIT) tablet Commonly known as: VITAMIN D3 Take 3,000 Units by mouth daily.   gabapentin 300 MG capsule Commonly known as: NEURONTIN Take 2 capsules (600 mg total) by mouth at bedtime.   Glucosamine-Chondroitin Tabs Take 2 tablets by mouth daily.   lisinopril 20 MG tablet Commonly known as: ZESTRIL Take 20 mg by mouth daily.    Multi-Vitamins Tabs Take 1 tablet by mouth daily.   Omega-3 1000 MG Caps Take 2 capsules by mouth daily.   zolpidem 10 MG tablet Commonly known as: AMBIEN Take 1 tablet (10 mg total) by mouth at bedtime as needed for sleep.   zolpidem 12.5 MG CR tablet Commonly known as: AMBIEN CR Take 1 tablet (12.5 mg total) by mouth at bedtime.        Signature:  Coralyn Helling, MD [facility] Pager - [phone] 02/13/2023, 1:39 PM

## 2023-02-13 NOTE — Patient Instructions (Signed)
Follow up in 1 year.

## 2023-02-22 ENCOUNTER — Telehealth: Payer: Commercial Managed Care - HMO | Admitting: Pulmonary Disease

## 2023-04-02 ENCOUNTER — Encounter (HOSPITAL_BASED_OUTPATIENT_CLINIC_OR_DEPARTMENT_OTHER): Payer: Self-pay | Admitting: Pulmonary Disease

## 2023-04-03 ENCOUNTER — Encounter (HOSPITAL_BASED_OUTPATIENT_CLINIC_OR_DEPARTMENT_OTHER): Payer: Self-pay | Admitting: Pulmonary Disease

## 2023-04-03 DIAGNOSIS — G4700 Insomnia, unspecified: Secondary | ICD-10-CM

## 2023-04-04 MED ORDER — ZOLPIDEM TARTRATE ER 12.5 MG PO TBCR: 12.5000 mg | EXTENDED_RELEASE_TABLET | Freq: Every day | ORAL | 5 refills | Status: DC

## 2023-04-04 NOTE — Telephone Encounter (Signed)
I sent the script to [facility] at [address].

## 2023-04-04 NOTE — Telephone Encounter (Signed)
Refill for zolpidem CR sent to [facility] at [address].

## 2023-04-04 NOTE — Telephone Encounter (Signed)
Pt calling upset no reply to his [facility] message. He is out of this medication and since it is a controlled substance knows it takes time to call/send in. Please call PT @ [phone]  Sending back [facility] due to nature of the call.

## 2023-08-27 ENCOUNTER — Telehealth: Payer: Self-pay

## 2023-08-27 NOTE — Telephone Encounter (Signed)
[facility] is calling patient is getting medication [facility]mg  [facility] Patient currently has a 60 day supply in less than a week from both pharmacies.

## 2023-08-31 ENCOUNTER — Telehealth: Payer: Self-pay

## 2023-08-31 ENCOUNTER — Other Ambulatory Visit: Payer: Self-pay

## 2023-08-31 DIAGNOSIS — G4700 Insomnia, unspecified: Secondary | ICD-10-CM

## 2023-08-31 MED ORDER — GABAPENTIN 300 MG PO CAPS: 600.0000 mg | ORAL_CAPSULE | Freq: Every day | ORAL | 5 refills | Status: AC

## 2023-08-31 MED ORDER — ZOLPIDEM TARTRATE ER 12.5 MG PO TBCR: 12.5000 mg | EXTENDED_RELEASE_TABLET | Freq: Every day | ORAL | 5 refills | Status: AC

## 2023-08-31 NOTE — Telephone Encounter (Signed)
Spoke with patient regarding prior message .Paitent stated that he did send his PCP a message to refer him to [facility] . Advised patient I did send his refill request to [facility] the DOD and once she looks at it she will send it in to the pharmacy. Patient's voice was understanding.Noting else further needed.

## 2023-08-31 NOTE — Telephone Encounter (Signed)
Patient needs refill of Zolpidem 12.5 and Gabapentin 300mg . Needs 1 month supply until he sees provider in [facility]  Last seen by Dr[facility]Sood 01/2023  [facility] [address] [facility] [place]  (Please see last encounter)

## 2023-08-31 NOTE — Telephone Encounter (Signed)
Patient requesting refill for Zolpidem and Gabapentin sent into pharmacy .   LOV 01/2023  Patient needs a 1 month supply sent into pharmacy until patient see's provider with [facility] .  Beth can you please advise.  Thank you

## 2023-08-31 NOTE — Telephone Encounter (Signed)
Patient needs a referral to be seen at Dr.Sood's new office with [facility]. Other then Dr.Sood he  was seen by Ames Dura NP in 2021.

## 2023-08-31 NOTE — Telephone Encounter (Signed)
Please email when referral is sent [email or web address] [place].

## 2023-09-06 NOTE — Telephone Encounter (Signed)
Spoke to [employer] with [facility]. He stated that patient is receiving [facility]  Clayburn Pert has closed out Rx and [facility]. Clayburn Pert stated that when he discussed it with patient. Pt stated that he has a hard time getting to the pharmacy, so he stock piles it.   Between 6/13-10/10, pt received eight 30 day Rx.   Clayburn Pert stated that all Rx on file with [facility] were prescribed by Dr. Craige Cotta.   Sp[facility] to [place] [facility]-she stated that there is a Rx on file for Ambien 12.5 billed to cash and a pending Rx for Ambien 10mg . Tobi Bastos stated that it appears that patient is using a discount card for 12.5mg  and filing insurance for the 10mg .  Tobi Bastos has canceled all pending Rx and will be faxing report to our office.    [facility] pt to discuss and he stated that he is [place] and he was unable to talk to me at this time and he ended the call prematurely.   Routing to [place], as she last prescribed 08/31/2023. Misty, please advise if any actions should be taken. Thanks

## 2023-09-07 NOTE — Telephone Encounter (Signed)
Patient of Dr. Craige Cotta. I havent seen in 2 years. He needs visit with sleep doc to address. I dont think he should be discharged until he follows up with MD and we can take necessary action if needed

## 2023-09-07 NOTE — Telephone Encounter (Signed)
Routing to Dr. Isaiah Serge per Misty's request.

## 2023-09-10 NOTE — Telephone Encounter (Signed)
Lm for patient to schedule appt

## 2023-09-12 NOTE — Telephone Encounter (Signed)
Lm x2 for patient.  Letter mailed to address on file.  Will close encounter per office protocol.

## 2023-09-12 NOTE — Telephone Encounter (Signed)
Zolpidem sent 08/31/23

## 2023-09-12 NOTE — Telephone Encounter (Signed)
Patient is trying to return missed call.

## 2023-12-21 ENCOUNTER — Other Ambulatory Visit: Payer: Self-pay | Admitting: Medical Genetics

## 2024-01-10 ENCOUNTER — Other Ambulatory Visit

## 2024-01-10 DIAGNOSIS — Z006 Encounter for examination for normal comparison and control in clinical research program: Secondary | ICD-10-CM

## 2024-03-05 LAB — GENECONNECT MOLECULAR SCREEN: Genetic Analysis Overall Interpretation: POSITIVE — AB

## 2024-03-06 ENCOUNTER — Telehealth: Payer: Self-pay | Admitting: Medical Genetics

## 2024-03-06 DIAGNOSIS — Z1509 Genetic susceptibility to other malignant neoplasm: Secondary | ICD-10-CM | POA: Insufficient documentation

## 2024-03-06 NOTE — Telephone Encounter (Addendum)
 [facility] Positive Result Note 03/06/2024 8:48 PM  FIRST ATTEMPT: Left message for patient to call me back.   03/07/2024 10:00 AM  SECOND ATTEMPT: Left message for patient to call me back.   03/10/2024 4:38 PM  THIRD ATTEMPT: Confirmed I was speaking with Jacob Ryan [id number] by using name and DOB. Informed participant the reason for this call is to provide results for the above study. Results revealed Lynch Syndrome. Genetic counseling was offered and participant requests a call back to schedule. All questions were answered, and participant was thanked for their time and support of the above study. Participant was encouraged to contact [facility] if they have any further questions or concerns.

## 2024-03-10 NOTE — Telephone Encounter (Signed)
 Thanks for the note. We will follow him up.   POD A RN can you please book this patient a follow up visit with me or APP, nonurgent, thanks.

## 2024-03-11 ENCOUNTER — Telehealth: Payer: Self-pay | Admitting: Medical Genetics

## 2024-03-11 NOTE — Progress Notes (Signed)
 Dr. General Kenner,  I spoke to pt and informed him that we need to see him for a non-urgent follow-up appt. Pt states his work sched makes it very difficult for him to get time off. He is asking if a virtual or telephone visit would be possible or if with the new findings if his procedure can just be sched? Please advise.   Thanks,  [employer] LPN

## 2024-03-11 NOTE — Telephone Encounter (Signed)
 [facility] 03/11/2024 3:03 PM  Confirmed I was speaking with Jacob Ryan [id number] by using name and DOB. Informed participant the reason for this call is to provide results for the above study. Genetic counseling was offered and participant is scheduled. All questions were answered, and participant was thanked for their time and support of the above study. Participant was encouraged to contact [facility] if they have any further questions or concerns.    [name], BS   [facility] Clinical Research Specialist II Direct Dial: [phone]  Fax: [phone]

## 2024-03-11 NOTE — Telephone Encounter (Signed)
 Left vm. Pt needs a f/up appt sched (non urgent) with Dr. General Kenner or Pod A APP.

## 2024-03-11 NOTE — Telephone Encounter (Signed)
Virtual visit is fine.  Thank you 

## 2024-03-12 NOTE — Telephone Encounter (Signed)
 Noted. Left vm for pt.   Pt needs to be sched for a mychart visit (virtual visit) okay per Dr. General Kenner. Pt will need instructions on how to download mychart?

## 2024-03-12 NOTE — Progress Notes (Signed)
 I spoke to pt and sched mychart video visit for 6/5 at 3:40. Pt verbalized understanding and encouraged to call our office with further questions, concerns, new or worsening symptoms.

## 2024-03-18 ENCOUNTER — Encounter: Payer: Self-pay | Admitting: Genetic Counselor

## 2024-03-18 ENCOUNTER — Ambulatory Visit: Admitting: Genetic Counselor

## 2024-03-18 DIAGNOSIS — Z1501 Genetic susceptibility to malignant neoplasm of breast: Secondary | ICD-10-CM

## 2024-03-18 NOTE — Progress Notes (Signed)
 PRIMARY PROVIDER:  Little Riff, MD  PRIMARY REASON FOR VISIT:  1. MSH6 gene mutation positive     Jacob Ryan, a 53 y.o. male, was seen for a [place] genetic counseling as follow up to his participation in the [place] Study, part of the [facility] Program. 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   RELEVANT MEDICAL HISTORY:  Jacob Ryan is a 53 y.o. male with a personal history of skin cancer on his scalp. He does not regularly complete skin checks. He had a colonoscopy 04/05/22 that detected five tubular adenoma. He completes PSA screening with his PCP.   Jacob Ryan was first diagnosed with high cholesterol several years ago, initially tried treatment but reports a reaction and stopped medication. He reports starting on a statin late 2024, managed by his PCP Dr. Marisa Sickles. His most recent cholesterol level (08/2023) showed an LDL-C of 154 mg/dL and total cholesterol of 232 mg/dL. Pre-treatment LDL-C 195 mg/dL, total 161 mg/dL in 0960.   Past Medical History:  Diagnosis Date   Chronic headaches    Chronic neck and back pain    Hypertension    Insomnia    OSA (obstructive sleep apnea) 05/31/2016   does not use CPAP   Vision abnormalities     Past Surgical History:  Procedure Laterality Date   ELBOW SURGERY Right    tendon repair   LASIK Bilateral 2001   NASAL SINUS SURGERY  2017   UPPER GI ENDOSCOPY  11/17/2015   Dr.Armbruster   VASECTOMY  1996   XI ROBOTIC ASSISTED INGUINAL HERNIA REPAIR WITH MESH Left 04/15/2021   Procedure: XI ROBOTIC ASSISTED INGUINAL HERNIA REPAIR WITH MESH;  Surgeon: Conrado Delay, DO;  Location: [facility] ORS;  Service: General;  Laterality: Left;    Social History   Socioeconomic History   Marital status: Divorced    Spouse name: Not on file   Number of children: Not on file   Years of education: Not on  file   Highest education level: Not on file  Occupational History   Not on file  Tobacco Use   Smoking status: Never   Smokeless tobacco: Never  Vaping Use   Vaping status: Never Used  Substance and Sexual Activity   Alcohol use: No   Drug use: No   Sexual activity: Not on file  Other Topics Concern   Not on file  Social History Narrative   Not on file   Social Drivers of Health   Financial Resource Strain: Not on file  Food Insecurity: Low Risk  (10/02/2023)   Received from [facility]   Hunger Vital Sign    Worried About Running Out of Food in the Last Year: Never true    Ran Out of Food in the Last Year: Never true  Transportation Needs: No Transportation Needs (10/02/2023)   Received from [facility]    In the past 12 months, has lack of reliable transportation kept you from medical appointments, meetings, work or from getting things needed for daily living? : No  Physical Activity: Not on file  Stress: Not on file  Social Connections: Not on file     FAMILY HISTORY:  We obtained a detailed, 3-generation family history.  Significant diagnoses are listed below: Family History  Problem Relation Age of Onset   Lymphoma Mother 58  High Cholesterol Mother    Hypertension Mother    Parkinsonism Father    High Cholesterol Sister    High Cholesterol Maternal Grandfather    Coronary artery disease Maternal Grandfather        Quadruple bypass sugery x3   Stroke Paternal Grandfather    Colon cancer Neg Hx    Esophageal cancer Neg Hx    Stomach cancer Neg Hx    Rectal cancer Neg Hx     Jacob Ryan reports his mother may have had genetic testing, but is unsure of her results. He will provide these to the genetic counselor via myChart for review. Patient's maternal ancestors are of N.European descent, and paternal ancestors are of N.European descent. There is no reported Ashkenazi Jewish ancestry. Jacob Ryan reports his sister was diagnosed with high  cholesterol and hypertension. He reports his mother was diagnosed with lymphoma at age 66 or 11, has high cholesterol and hypertension, and is living at 41. He reports his maternal grandfather had high cholesterol and coronary artery disease, treated with a quadruple bypass three times. He reports his maternal grandfather had two brothers with heart disease that passed away over the age of 47. Jacob Ryan reports his father was diagnosed with Parkinson's disease and passed away at age 46 from complications. His paternal grandfather had a stroke and passed away in his 64s. Jacob Ryan does not report any additional family history of heart disease, cancer, birth defects, intellectual/learning disability, developmental delay, or history of multiple miscarriages.     GENETIC TEST RESULTS:  Jacob Ryan participated in the [place] [facility] population genomic screening program. A single, heterozygous pathogenic variant was detected in the MSH6 gene called c.[id number] (p.[id number]). There were no pathogenic or likely pathogenic variants detected in other genes reported on in this study.   [facility] Population Screen is a screening test that analyzes 11 genes related to hereditary breast and ovarian cancer syndrome (HBOC), Lynch syndrome, and familial hypercholesterolemia. These include APOB, BRCA1, BRCA2, EPCAM, LDLR, LDLRAP1, PCSK9, PMS2 (excluding exons 11-15), MLH1, MSH2, MSH6. This test reports pathogenic and likely pathogenic variants, but does not report on variants of unknown significance. The absence of any additional pathogenic variants is reassuring, but does not rule out a hereditary condition. There are other variants and genes associated with heart disease, hereditary cancer and other inherited conditions that were not included in this test. Please see full test report in labs, labeled "[facility] Molecular Screen," dated 01/10/24. A section of the report is included below.      CLINICAL INFORMATION:  Pathogenic variants or mutations in MSH6 are associated with a condition called Lynch syndrome. Lynch syndrome increases risk for colorectal, endometrial, ovarian, urinary tract, and other cancers. The cancer risks and management recommendations associated with MSH6 mutations are listed below:  Type of Cancer MSH6 Mutation Lifetime Risk Average Lifetime Risk  Colorectal 10-44% 4.1%  Endometrial (uterus) 16-49% 3.1%  Ovarian Up to 1.0-13% 1.1%  Renal pelvis/ureter 0.7-5.5% -   Bladder 1.0-8.2% 2.3%  Gastric (stomach) Up to 1.0-7.9% 0.8%  Small bowel Up to 1.0-4.0% 0.3%  Pancreas 1.4-1.6% 1.7%  Biliary Tract Up to 1% -  Prostate 2.5-11.6% 12.6%  Brain 0.8-1.8% 0.5%   Benign and malignant skin tumors such as sebaceous adenomas, sebaceous adenocarcinomas, and keratoacanthomas has been reported to be increased among patients with Lynch syndrome. Cumulative lifetime risk specific to MSH6 mutation carriers is not available.  Risk numbers are based on NCCN v4.2024. Risk estimates may evolve over  time as new information is learned about MSH6 mutations.    Management Recommendations: Per NCCN v4.2024, Genetic/Familial High Risk Assessment: Colorectal, Endometrial, and Gastric.   Colorectal Cancer Screening: High quality colonoscopy at age 31-35 (or 2-5 years prior to the earliest colon cancer if it is diagnosed before age 45) and repeat every 1-3 years Consider using daily aspirin to reduce the risk of colorectal cancer. The decision to use aspirin should be made on an individual basis, including discussion of individual risks, benefits, adverse effects, and childbearing plans with a healthcare provider.   Endometrial Cancer Screening/Risk Reduction: Women should report any abnormal uterine bleeding or postmenopausal bleeding. The evaluation of these symptoms should include an endometrial biopsy. A hysterectomy may be considered. The timing should be individualized  based on whether childbearing is complete, comorbidities, and family history. Endometrial cancer screening does not have a proven benefit in women with Lynch Syndrome. However, endometrial biopsy is highly sensitive and specific as a diagnostic procedure. Screening via endometrial biopsy every 1-2 years starting at age 37-35 can be considered. Transvaginal ultrasounds may be considered in postmenopausal women at their clinician's discretion.  Ovarian Cancer Screening/Risk Reduction: A prophylactic bilateral salpingo-oophorectomy (BSO), or having the ovaries and fallopian tubes removed, may be considered. A BSO is estimated to reduce the risk of ovarian cancer by up to 96%. Timing of a BSO should be individualized based on whether childbearing is complete, menopause status, comorbidities, and family history. Women should be aware of symptoms that might be associated with the development of ovarian cancer including pelvic or abdominal pain, bloating, increased abdominal girth, difficulty eating, early satiety, or urinary frequency or urgency. Symptoms that persist for several weeks and are a change from a woman's baseline should prompt evaluation by her physician. Current data does not support routine ovarian screening for Lynch syndrome, therefore it may be considered at the clinician's discretion. Screening includes transvaginal ultrasounds and a blood test to measure CA-125 levels every 6-12 months.  Urothelial Cancer Screening: Annual urinalysis starting at age 59-35 may be considered in selected individuals such as those with a family history of urothelial cancer (renal pelvis, ureter, and/or bladder).   Gastric and Small Bowel Cancer Screening: Esophagogastroduodenoscopy (EGD) starting at age 59-40 years and repeat every 2-4 years, preferably performed in conjunction with colonoscopy.  Age of initiation prior to 30 years and/or surveillance interval less than 2 years may be considered based on  family history or high-risk endoscopic findings. Random biopsy of the proximal and distal stomach should at minimum be performed on the initial procedure to assess for H. Pylori, autoimmune gastritis, and intestinal metaplasia.  Push enteroscopy can be considered in place of EGD to enhance small bowel visualization. Individuals not undergoing upper endoscopic surveillance should have one-time noninvasive testing for H. pylori at the time of Lynch Syndrome diagnosis, with treatment indicated if H. pylori is detected.    Pancreatic Cancer Screening: Avoid smoking, heavy alcohol use, and obesity. It has been suggested that pancreatic cancer screening be limited to those with a family history of pancreatic cancer (first- or second-degree relative). Ideally, screening should be performed in experienced centers utilizing a multidisciplinary approach under research conditions. Recommended screening include annual endoscopic ultrasound (preferred) and/or MRI of the pancreas starting at age 32 or 41 years younger than the earliest age diagnosis in the family. Annual concurrent CA19-9 testing should also be considered.  Brain Cancer Screening: Patients should be educated regarding signs and symptoms of neurologic cancer and the importance of prompt  reporting of abnormal symptoms to their physicians.  Prostate Cancer Screening: Consider beginning annual PSA blood test and baseline digital rectal exam at age 30.  Skin Manifestations:  Consider skin exam every 1-2 years with a healthcare provider experienced in identifying skin manifestations of Lynch syndrome.   Additional Considerations: Patients of reproductive age should be made aware of options for prenatal diagnosis and assisted reproduction including pre-implantation genetic diagnosis. Individuals with a single pathogenic MSH6 variant are also carriers of constitutional mismatch repair deficiency (CMMRD) syndrome. CMMRD is a childhood-onset cancer  predisposition syndrome that can present with hematological malignancies, cancers of the brain and central nervous system, Lynch syndrome-associated cancers (colon, uterine, small bowel, urinary tract), embryonic tumors, and sarcomas. Some affected individuals may also display cafe-au-lait macules. For there to be a risk of CMMRD in offspring, an individual and their partner would each have to have a single pathogenic variant in the same MMR gene; in such a case, the risk of having an affected child is 25%.  This information is based on current understanding of the gene and may change in the future.    IMPLICATIONS FOR FAMILY MEMBERS: Individuals with MSH6 are encouraged to share information about their genetic test results with family members. A family letter will be provided to help share this result with relatives. "Cascade screening" is a systematic process through which additional family members with a MSH6 mutation are identified, starting with all first degree relatives over the age of 85 (parents, siblings, and children) of people who have a MSH6 mutation.   Hereditary predisposition to cancer due to pathogenic variants in the MSH6 gene has autosomal dominant inheritance. This means that an individual with a pathogenic variant has a 50% chance of passing the condition on to their children. Identification of a pathogenic variant allows for the recognition of at-risk relatives who can pursue testing for the familial variant.  Family members are encouraged to consider genetic testing for this familial pathogenic variant. As there are generally no childhood cancer risks associated with pathogenic variants in the MSH6 gene, individuals in the family are not typically recommended to have testing until they reach at least 54 years of age. They may contact our office at [phone] for more information or to schedule an appointment. Family members who live outside of the area are encouraged to find a  genetic counselor in their area by visiting: [email or web address].  RESOURCES: Facing Our Risk of Cancer Empowered (FORCE): www.facingourrisk.org   Colon Cancer Alliance for Research and Education for Lynch Syndrome: SnitchSeek.be  [facility] Inherited Cancer Registry: https://inheritedcancer.net/  ADDITIONAL TESTING: Given Jacob Ryan personal and family history of high cholesterol, negative analysis of APOB, LDLR, LDLRAP1, PCSK9, does not rule out a diagnosis of hereditary high cholesterol (PMID 16109604). Diagnostic testing, including a broader dyslipidemia panel, could be considered. Additionally, obtaining further information on the family history of high cholesterol, including LDL-C ranges and cardiovascular health history for family members could be helpful. Jacob Ryan will notify us  if he is interested in further diagnostic genetic testing or evaluation for hereditary high cholesterol.    PLAN:  Referral to Gastroenterology is recommended to further discuss options for colon cancer screening and prevention. Jacob Ryan was referred back to his GI, Dr. General Kenner, to discuss updated recommendations for colonoscopy and EGD screening based on this result.   Results will be shared with Jacob Ryan primary care provider, Little Riff, MD for further management.   Genetic test results should be shared with relatives, who can  consider undergoing genetic testing for the MSH6 pathogenic variant. Family members are welcome to contact us  if they need assistance connecting with a Dentist. [facility] offers no cost testing for the familial variant if completed within 90 days of Jacob Ryan report.   Lastly, we encouraged Jacob Ryan to remain in contact with [facility] genetics team so that we can continuously update the family history and inform him of any changes in our understanding of Lynch syndrome and any additional genetic testing that may be of benefit for  this family.   Jacob Ryan questions were answered to his satisfaction today. Our contact information was provided should additional questions or concerns arise.   Jobie Mulders, MS, [facility] Licensed, [email or web address] phone: [phone]  75 minutes were spent on the date of the encounter in service to the patient including preparation, face-to-face consultation, documentation and care coordination. The patient was seen alone.   I connected with  Mr. Vines on 03/18/2024 at 10:30 AM EDT by telephone and verified that I am speaking with the correct person using two identifiers.   Patient location: [place] Provider location: [place]  __________________________________________________________________ For Office Staff:  Number of people involved in session: 1 Was an Intern/ student involved with case: no

## 2024-03-27 ENCOUNTER — Encounter: Payer: Self-pay | Admitting: Gastroenterology

## 2024-03-27 ENCOUNTER — Telehealth: Admitting: Gastroenterology

## 2024-03-27 VITALS — Ht 72.0 in | Wt 172.0 lb

## 2024-03-27 DIAGNOSIS — Z860100 Personal history of colon polyps, unspecified: Secondary | ICD-10-CM

## 2024-03-27 DIAGNOSIS — Z1509 Genetic susceptibility to other malignant neoplasm: Secondary | ICD-10-CM

## 2024-03-27 MED ORDER — NA SULFATE-K SULFATE-MG SULF 17.5-3.13-1.6 GM/177ML PO SOLN: 1.0000 | ORAL | 0 refills | Status: DC

## 2024-03-27 NOTE — Progress Notes (Signed)
 HPI :  Virtual Visit via Video Note  I connected with Jacob Ryan on 03/27/24 at  3:40 PM EDT by a video enabled telemedicine application and verified that I am speaking with the correct person using two identifiers.  Location: Patient: home Provider: office   I discussed the limitations of evaluation and management by telemedicine and the availability of in person appointments. The patient expressed understanding and agreed to proceed.  Patient here for a virtual visit to discuss recent genetic testing.  He participated in [facility] health genetic testing and was found to have an MSH6 gene mutation.  This can increase his risk for multiple malignancies including gastric and colon (Lynch syndrome), and here to be reevaluated  He last had a colonoscopy just over 2 years ago, few polyps removed, at least 1 was an adenoma.  He had an EGD in 2017 without any concerning findings.  His mother had lymphoma but otherwise no family history of colon cancer, gastric cancer, pancreatic cancer, renal cell, ovarian, etc.  He denies any problems with reflux or indigestion.  He denies any dysphagia.  He has decreased appetite over many years and states he just does not eat as much as he used to but denies early satiety or stomach pains.  He has lost what he thinks is about 30 pounds over the past 5 years.  He is not trying to lose weight.  He denies any abdominal pains.  Denies any problems with his bowels for the most part.  No blood in his stools other than scant bleeding when constipated which is rare.  He has seen a dermatologist in the past, states he has a history of some skin cancers.  He denies any cardiopulmonary symptoms.  Denies any issues with anesthesia in the past.  Colonoscopy 04/05/22: - The perianal and digital rectal examinations were normal. - A 4 mm polyp was found in the sigmoid colon. The polyp was sessile. The polyp was removed with a cold snare. Resection and retrieval were complete. -  Three sessile polyps were found in the recto-sigmoid colon. The polyps were 2 to 3 mm in size. These polyps were removed with a cold snare. Resection and retrieval were complete. - A diminutive polyp was found in the distal rectum. The polyp was sessile. The polyp was removed with a cold snare. Resection and retrieval were complete. - Internal hemorrhoids were found during retroflexion. - The exam was otherwise without abnormality.  Surgical [P], colon, rectum, sigmoid, and recto-sigmoid, polyp (5) TUBULAR ADENOMA HYPERPLASTIC POLYPS NEGATIVE FOR HIGH-GRADE DYSPLASIA AND CARCINOMA    EGD 11/07/2015: ENDOSCOPIC IMPRESSION: Normal esophagus Benign appearing gastric polyps as outlined above, representative sample taken Mild gastric erythema, biopsies obtained Normal duodenum   1. Surgical [P], gastric polyp - BENIGN FUNDIC GLAND POLYP(S). 2. Surgical [P], gastric antrum and gastric body BENIGN GASTRIC MUCOSA. NO HELICOBACTER PYLORI, INTESTINAL METAPLASIA OR ACTIVE INFLAMMATION IDENTIFIED.      Past Medical History:  Diagnosis Date   Chronic headaches    Chronic neck and back pain    Hypertension    Insomnia    OSA (obstructive sleep apnea) 05/31/2016   does not use CPAP   Vision abnormalities      Past Surgical History:  Procedure Laterality Date   ELBOW SURGERY Right    tendon repair   LASIK Bilateral 2001   NASAL SINUS SURGERY  2017   UPPER GI ENDOSCOPY  11/17/2015   Dr.Milka Windholz   VASECTOMY  1996   XI  ROBOTIC ASSISTED INGUINAL HERNIA REPAIR WITH MESH Left 04/15/2021   Procedure: XI ROBOTIC ASSISTED INGUINAL HERNIA REPAIR WITH MESH;  Surgeon: Conrado Delay, DO;  Location: [facility] ORS;  Service: General;  Laterality: Left;   Family History  Problem Relation Age of Onset   Lymphoma Mother 75   High Cholesterol Mother    Hypertension Mother    Parkinsonism Father    High Cholesterol Sister    High Cholesterol Maternal Grandfather    Coronary artery disease Maternal  Grandfather        Quadruple bypass sugery x3   Stroke Paternal Grandfather    Colon cancer Neg Hx    Esophageal cancer Neg Hx    Stomach cancer Neg Hx    Rectal cancer Neg Hx    Social History   Tobacco Use   Smoking status: Never   Smokeless tobacco: Never  Vaping Use   Vaping status: Never Used  Substance Use Topics   Alcohol use: No   Drug use: No   Current Outpatient Medications  Medication Sig Dispense Refill   cholecalciferol (VITAMIN D3) 25 MCG (1000 UNIT) tablet Take 3,000 Units by mouth daily.     gabapentin  (NEURONTIN ) 300 MG capsule Take 2 capsules (600 mg total) by mouth at bedtime. 60 capsule 5   Glucosamine-Chondroit-Vit C-Mn (GLUCOSAMINE-CHONDROITIN) TABS Take 2 tablets by mouth daily.     lisinopril (PRINIVIL,ZESTRIL) 20 MG tablet Take 20 mg by mouth daily.     Multiple Vitamin (MULTI-VITAMINS) TABS Take 1 tablet by mouth daily.     Omega-3 1000 MG CAPS Take 2 capsules by mouth daily.     vitamin C (ASCORBIC ACID) 500 MG tablet Take 500 mg by mouth daily.     zolpidem  (AMBIEN  CR) 12.5 MG CR tablet Take 1 tablet (12.5 mg total) by mouth at bedtime. 30 tablet 5   zolpidem  (AMBIEN ) 10 MG tablet Take 1 tablet (10 mg total) by mouth at bedtime as needed for sleep. 30 tablet 5   No current facility-administered medications for this visit.   Allergies  Allergen Reactions   Amoxicillin Hives   Other     Feathers - sneezing   Pollen Extract    Latex Rash     Review of Systems: All systems reviewed and negative except where noted in HPI.   Labs reviewed in Epic - no anemia  Physical Exam: Ht 6' (1.829 m)   Wt 172 lb (78 kg)   BMI 23.33 kg/m  Constitutional: Pleasant,well-developed, male in no acute distress.   ASSESSMENT: 53 y.o. male here for assessment of the following  1. MSH6 gene mutation positive   2. History of colon polyps    Recently participated in genetic study through [facility] health and had a MSH6 gene mutation.  I reviewed what this is  with him and risks for particular malignancies.  His colonoscopy is up-to-date and he had some polyps on the last exam.  We discussed timing of his next exam, per guidelines they recommend anywhere from every 1 to 3 years based on this mutation, he is currently 2 years out from his last procedure.  Following discussion of colonoscopy and risks and benefits he wants to proceed at this time.  At the same time I would also recommend an EGD to screen his upper tract, doing this every 3 to 4 years or so per guidelines.  He is agreeable with this and wishes to do them at the same time.  He does not have much appetite  as he used to and he states over many years he has lost some weight from this but otherwise has no specific symptoms otherwise.  We will start with EGD and colonoscopy to evaluate his genetic mutation and weight loss.  If negative and continues to lose weight can do CT scan.  His urinalysis is up-to-date and normal.  He does have a history of skin cancers and recommend he see his dermatologist  He agrees with the plan.  All questions answered.  PLAN: - schedule EGD and colonoscopy at the [facility] - urinalysis yearly (currently UTD) - see dermatologist for follow up, history of skin cancers   I discussed the assessment and treatment plan with the patient. The patient was provided an opportunity to ask questions and all were answered. The patient agreed with the plan and demonstrated an understanding of the instructions.   The patient was advised to call back or seek an in-person evaluation if the symptoms worsen or if the condition fails to improve as anticipated.  I provided 10 minutes of face-to-face and additional 10 minutes of non-face to face time, during this encounter.   Christi Coward, MD [facility]  CC: Little Riff, MD

## 2024-03-27 NOTE — Patient Instructions (Signed)
 You have been scheduled for a colonoscopy. Please follow written instructions given to you at your visit today.   If you use inhalers (even only as needed), please bring them with you on the day of your procedure.  DO NOT TAKE 7 DAYS PRIOR TO TEST- Trulicity (dulaglutide) Ozempic, Wegovy (semaglutide) Mounjaro (tirzepatide) Bydureon Bcise (exanatide extended release)  DO NOT TAKE 1 DAY PRIOR TO YOUR TEST Rybelsus (semaglutide) Adlyxin (lixisenatide) Victoza (liraglutide) Byetta (exanatide) ___________________________________________________________________________   We have sent the following medications to your pharmacy for you to pick up at your convenience: Suprep   Due to recent changes in healthcare laws, you may see the results of your imaging and laboratory studies on MyChart before your provider has had a chance to review them.  We understand that in some cases there may be results that are confusing or concerning to you. Not all laboratory results come back in the same time frame and the provider may be waiting for multiple results in order to interpret others.  Please give us  48 hours in order for your provider to thoroughly review all the results before contacting the office for clarification of your results.   Thank you for choosing me and [facility].  Dr. General Kenner

## 2024-05-14 ENCOUNTER — Ambulatory Visit: Admitting: Gastroenterology

## 2024-05-14 ENCOUNTER — Encounter: Payer: Self-pay | Admitting: Gastroenterology

## 2024-05-14 VITALS — BP 100/55 | HR 75 | Temp 97.5°F | Resp 13 | Ht 72.0 in | Wt 177.0 lb

## 2024-05-14 DIAGNOSIS — Z860100 Personal history of colon polyps, unspecified: Secondary | ICD-10-CM

## 2024-05-14 DIAGNOSIS — K5730 Diverticulosis of large intestine without perforation or abscess without bleeding: Secondary | ICD-10-CM

## 2024-05-14 DIAGNOSIS — K31A19 Gastric intestinal metaplasia without dysplasia, unspecified site: Secondary | ICD-10-CM | POA: Diagnosis not present

## 2024-05-14 DIAGNOSIS — Z1501 Genetic susceptibility to malignant neoplasm of breast: Secondary | ICD-10-CM

## 2024-05-14 DIAGNOSIS — K3189 Other diseases of stomach and duodenum: Secondary | ICD-10-CM

## 2024-05-14 DIAGNOSIS — K317 Polyp of stomach and duodenum: Secondary | ICD-10-CM

## 2024-05-14 DIAGNOSIS — K648 Other hemorrhoids: Secondary | ICD-10-CM

## 2024-05-14 DIAGNOSIS — K633 Ulcer of intestine: Secondary | ICD-10-CM | POA: Diagnosis not present

## 2024-05-14 DIAGNOSIS — K639 Disease of intestine, unspecified: Secondary | ICD-10-CM

## 2024-05-14 DIAGNOSIS — K2950 Unspecified chronic gastritis without bleeding: Secondary | ICD-10-CM | POA: Diagnosis not present

## 2024-05-14 MED ORDER — SODIUM CHLORIDE 0.9 % IV SOLN: 500.0000 mL | INTRAVENOUS | Status: DC

## 2024-05-14 NOTE — Progress Notes (Signed)
 Called to room to assist during endoscopic procedure.  Patient ID and intended procedure confirmed with present staff. Received instructions for my participation in the procedure from the performing physician.

## 2024-05-14 NOTE — Patient Instructions (Signed)
 AVOID ASPIRIN, ASPIRIN CONTAINING PRODUCTS (BC OR GOODY POWDERS) OR NSAIDS (IBUPROFEN , ADVIL , ALEVE, AND MOTRIN ); TYLENOL  IS OK TO TAKE  YOU HAD AN ENDOSCOPIC PROCEDURE TODAY AT THE [facility]:   Refer to the procedure report that was given to you for any specific questions about what was found during the examination.  If the procedure report does not answer your questions, please call your gastroenterologist to clarify.  If you requested that your care partner not be given the details of your procedure findings, then the procedure report has been included in a sealed envelope for you to review at your convenience later.  YOU SHOULD EXPECT: Some feelings of bloating in the abdomen. Passage of more gas than usual.  Walking can help get rid of the air that was put into your GI tract during the procedure and reduce the bloating. If you had a lower endoscopy (such as a colonoscopy or flexible sigmoidoscopy) you may notice spotting of blood in your stool or on the toilet paper. If you underwent a bowel prep for your procedure, you may not have a normal bowel movement for a few days.  Please Note:  You might notice some irritation and congestion in your nose or some drainage.  This is from the oxygen used during your procedure.  There is no need for concern and it should clear up in a day or so.  SYMPTOMS TO REPORT IMMEDIATELY:  Following lower endoscopy (colonoscopy or flexible sigmoidoscopy):  Excessive amounts of blood in the stool  Significant tenderness or worsening of abdominal pains  Swelling of the abdomen that is new, acute  Fever of 100F or higher  Following upper endoscopy (EGD)  Vomiting of blood or coffee ground material  New chest pain or pain under the shoulder blades  Painful or persistently difficult swallowing  New shortness of breath  Fever of 100F or higher  Black, tarry-looking stools  For urgent or emergent issues, a gastroenterologist can be reached at any  hour by calling [phone]. Do not use MyChart messaging for urgent concerns.    DIET:  We do recommend a small meal at first, but then you may proceed to your regular diet.  Drink plenty of fluids but you should avoid alcoholic beverages for 24 hours.  ACTIVITY:  You should plan to take it easy for the rest of today and you should NOT DRIVE or use heavy machinery until tomorrow (because of the sedation medicines used during the test).    FOLLOW UP: Our staff will call the number listed on your records the next business day following your procedure.  We will call around 7:15- 8:00 am to check on you and address any questions or concerns that you may have regarding the information given to you following your procedure. If we do not reach you, we will leave a message.     If any biopsies were taken you will be contacted by phone or by letter within the next 1-3 weeks.  Please call us  at [phone] if you have not heard about the biopsies in 3 weeks.    SIGNATURES/CONFIDENTIALITY: You and/or your care partner have signed paperwork which will be entered into your electronic medical record.  These signatures attest to the fact that that the information above on your After Visit Summary has been reviewed and is understood.  Full responsibility of the confidentiality of this discharge information lies with you and/or your care-partner.

## 2024-05-14 NOTE — Op Note (Signed)
 [facility] Patient Name: Jacob Ryan Procedure Date: 05/14/2024 2:09 PM MRN: [id number] Endoscopist: Elspeth P. Leigh , MD, [id number] Age: 53 Referring MD:  Date of Birth: 07/25/1971 Gender: Male Account #: [id number] Procedure:                Upper GI endoscopy Indications:              MSH6 gene mutation Medicines:                Monitored Anesthesia Care Procedure:                Pre-Anesthesia Assessment:                           - Prior to the procedure, a History and Physical                            was performed, and patient medications and                            allergies were reviewed. The patient's tolerance of                            previous anesthesia was also reviewed. The risks                            and benefits of the procedure and the sedation                            options and risks were discussed with the patient.                            All questions were answered, and informed consent                            was obtained. Prior Anticoagulants: The patient has                            taken no anticoagulant or antiplatelet agents. ASA                            Grade Assessment: II - A patient with mild systemic                            disease. After reviewing the risks and benefits,                            the patient was deemed in satisfactory condition to                            undergo the procedure.                           After obtaining informed consent, the endoscope was  passed under direct vision. Throughout the                            procedure, the patient's blood pressure, pulse, and                            oxygen saturations were monitored continuously. The                            Olympus Scope SN [id number] was introduced through the                            mouth, and advanced to the second part of duodenum.                            The upper GI endoscopy was  accomplished without                            difficulty. The patient tolerated the procedure                            well. Scope In: Scope Out: Findings:                 Esophagogastric landmarks were identified: the                            Z-line was found at 42 cm, the gastroesophageal                            junction was found at 42 cm and the upper extent of                            the gastric folds was found at 42 cm from the                            incisors.                           The exam of the esophagus was otherwise normal.                           Prominent gastric folds vs. benign appearing                            hyperplastic changes, were found in the gastric                            antrum and in the prepyloric region of the stomach.                            Biopsies were taken with a cold forceps for  histology.                           Multiple small sessile polyps were found in the                            gastric body. A few of these polyps were removed as                            a representative sample with a cold biopsy forceps.                            Resection and retrieval were complete.                           The exam of the stomach was otherwise normal.                           Biopsies were taken with a cold forceps for                            Helicobacter pylori testing.                           The examined duodenum was normal. The ampulla was                            visualized and normal. Complications:            No immediate complications. Estimated blood loss:                            Minimal. Estimated Blood Loss:     Estimated blood loss was minimal. Impression:               - Esophagogastric landmarks identified.                           - Normal esophagus otherwise.                           - Enlarged gastric folds vs. benign hyperplastic                             changes of the antrum. Biopsied.                           - Multiple gastric polyps. Representative samples                            resected and retrieved.                           - Normal stomach otherwise - biopsies taken to rule                            out H  pylori.                           - Normal examined duodenum. Recommendation:           - Patient has a contact number available for                            emergencies. The signs and symptoms of potential                            delayed complications were discussed with the                            patient. Return to normal activities tomorrow.                            Written discharge instructions were provided to the                            patient.                           - Resume previous diet.                           - Continue present medications.                           - Await pathology results. Elspeth P. Leigh, MD 05/14/2024 2:50:38 PM This report has been signed electronically.

## 2024-05-14 NOTE — Progress Notes (Signed)
  Gastroenterology History and Physical   Primary Care Physician:  Rudolpho Norleen BIRCH, MD   Reason for Procedure:   MSH6 gene mutation  Plan:    EGD and colonoscopy     HPI: Jacob Ryan is a 53 y.o. male  here for EGD and colonoscopy to evaluate gene mutation MSH6 positive - screening. See office note 03/27/24 for details.   Patient denies any bowel symptoms at this time other than some constipation. No family history of colon cancer known. Otherwise feels well without any cardiopulmonary symptoms.   I have discussed risks / benefits of anesthesia and endoscopic procedure with Thorne D Longest and they wish to proceed with the exams as outlined today.    Past Medical History:  Diagnosis Date   Allergy    Chronic headaches    Chronic neck and back pain    Hyperlipidemia    Hypertension    Insomnia    OSA (obstructive sleep apnea) 05/31/2016   does not use CPAP   Vision abnormalities     Past Surgical History:  Procedure Laterality Date   ELBOW SURGERY Right    tendon repair   LASIK Bilateral 2001   NASAL SINUS SURGERY  2017   UPPER GI ENDOSCOPY  11/17/2015   Dr.Miko Markwood   VASECTOMY  1996   XI ROBOTIC ASSISTED INGUINAL HERNIA REPAIR WITH MESH Left 04/15/2021   Procedure: XI ROBOTIC ASSISTED INGUINAL HERNIA REPAIR WITH MESH;  Surgeon: Tye Millet, DO;  Location: [facility] ORS;  Service: General;  Laterality: Left;    Prior to Admission medications   Medication Sig Start Date End Date Taking? Authorizing Provider  atorvastatin (LIPITOR) 20 MG tablet Take 20 mg by mouth daily.   Yes [provider]  cholecalciferol (VITAMIN D3) 25 MCG (1000 UNIT) tablet Take 3,000 Units by mouth daily.   Yes [provider]  gabapentin  (NEURONTIN ) 300 MG capsule Take 2 capsules (600 mg total) by mouth at bedtime. 08/31/23  Yes Hope Almarie ORN, NP  Glucosamine-Chondroit-Vit C-Mn (GLUCOSAMINE-CHONDROITIN) TABS Take 2 tablets by mouth daily.   Yes [provider]  lisinopril (PRINIVIL,ZESTRIL) 20 MG tablet Take 20 mg by mouth daily.   Yes [provider]  Multiple Vitamin (MULTI-VITAMINS) TABS Take 1 tablet by mouth daily.   Yes [provider]  Omega-3 1000 MG CAPS Take 2 capsules by mouth daily.   Yes [provider]  vitamin C (ASCORBIC ACID) 500 MG tablet Take 500 mg by mouth daily.   Yes [provider]  zolpidem  (AMBIEN  CR) 12.5 MG CR tablet Take 1 tablet (12.5 mg total) by mouth at bedtime. 08/31/23   Hope Almarie ORN, NP  zolpidem  (AMBIEN ) 10 MG tablet Take 1 tablet (10 mg total) by mouth at bedtime as needed for sleep. 02/13/23 08/12/23  Sood, Vineet, MD    Current Outpatient Medications  Medication Sig Dispense Refill   atorvastatin (LIPITOR) 20 MG tablet Take 20 mg by mouth daily.     cholecalciferol (VITAMIN D3) 25 MCG (1000 UNIT) tablet Take 3,000 Units by mouth daily.     gabapentin  (NEURONTIN ) 300 MG capsule Take 2 capsules (600 mg total) by mouth at bedtime. 60 capsule 5   Glucosamine-Chondroit-Vit C-Mn (GLUCOSAMINE-CHONDROITIN) TABS Take 2 tablets by mouth daily.     lisinopril (PRINIVIL,ZESTRIL) 20 MG tablet Take 20 mg by mouth daily.     Multiple Vitamin (MULTI-VITAMINS) TABS Take 1 tablet by mouth daily.     Omega-3 1000 MG CAPS Take 2 capsules  by mouth daily.     vitamin C (ASCORBIC ACID) 500 MG tablet Take 500 mg by mouth daily.     zolpidem  (AMBIEN  CR) 12.5 MG CR tablet Take 1 tablet (12.5 mg total) by mouth at bedtime. 30 tablet 5   zolpidem  (AMBIEN ) 10 MG tablet Take 1 tablet (10 mg total) by mouth at bedtime as needed for sleep. 30 tablet 5   Current Facility-Administered Medications  Medication Dose Route Frequency Provider Last Rate Last Admin   0.9 %  sodium chloride  infusion  500 mL Intravenous Continuous Denijah Karrer, Elspeth SQUIBB, MD        Allergies as of 05/14/2024 - Review Complete 05/14/2024  Allergen Reaction Noted   Amoxicillin Hives and Dermatitis 05/26/2013   Latex Rash and  Dermatitis 06/30/2014   Other Other (See Comments) 11/02/2015   Pollen extract Other (See Comments) 06/30/2015    Family History  Problem Relation Age of Onset   Lymphoma Mother 26   High Cholesterol Mother    Hypertension Mother    Parkinsonism Father    High Cholesterol Sister    High Cholesterol Maternal Grandfather    Coronary artery disease Maternal Grandfather        Quadruple bypass sugery x3   Stroke Paternal Grandfather    Colon cancer Neg Hx    Esophageal cancer Neg Hx    Stomach cancer Neg Hx    Rectal cancer Neg Hx     Social History   Socioeconomic History   Marital status: Divorced    Spouse name: Not on file   Number of children: 0   Years of education: Not on file   Highest education level: Not on file  Occupational History   Not on file  Tobacco Use   Smoking status: Never   Smokeless tobacco: Never  Vaping Use   Vaping status: Never Used  Substance and Sexual Activity   Alcohol use: No   Drug use: No   Sexual activity: Not on file  Other Topics Concern   Not on file  Social History Narrative   Not on file   Social Drivers of Health   Financial Resource Strain: Not on file  Food Insecurity: Low Risk  (10/02/2023)   Received from [facility]   Hunger Vital Sign    Within the past 12 months, you worried that your food would run out before you got money to buy more: Never true    Within the past 12 months, the food you bought just didn't last and you didn't have money to get more. : Never true  Transportation Needs: No Transportation Needs (10/02/2023)   Received from [facility]    In the past 12 months, has lack of reliable transportation kept you from medical appointments, meetings, work or from getting things needed for daily living? : No  Physical Activity: Not on file  Stress: Not on file  Social Connections: Not on file  Intimate Partner Violence: Not on file    Review of Systems: All other review of systems  negative except as mentioned in the HPI.  Physical Exam: Vital signs BP 114/70   Pulse 61   Temp (!) 97.5 F (36.4 C)   Ht 6' (1.829 m)   Wt 177 lb (80.3 kg)   SpO2 96%   BMI 24.01 kg/m   General:   Alert,  Well-developed, pleasant and cooperative in NAD Lungs:  Clear throughout to auscultation.   Heart:  Regular rate and rhythm  Abdomen:  Soft, nontender and nondistended.   Neuro/Psych:  Alert and cooperative. Normal mood and affect. A and O x 3  Marcey Naval, MD [facility]

## 2024-05-14 NOTE — Op Note (Signed)
 [facility] Patient Name: Jacob Ryan Procedure Date: 05/14/2024 2:08 PM MRN: [id number] Endoscopist: Elspeth P. Leigh , MD, [id number] Age: 53 Referring MD:  Date of Birth: [birth year] Gender: Male Account #: [id number] Procedure:                Colonoscopy Indications:              MSH6 gene mutation - last exam 03/2022 with a few                            polyps removed Medicines:                Monitored Anesthesia Care Procedure:                Pre-Anesthesia Assessment:                           - Prior to the procedure, a History and Physical                            was performed, and patient medications and                            allergies were reviewed. The patient's tolerance of                            previous anesthesia was also reviewed. The risks                            and benefits of the procedure and the sedation                            options and risks were discussed with the patient.                            All questions were answered, and informed consent                            was obtained. Prior Anticoagulants: The patient has                            taken no anticoagulant or antiplatelet agents. ASA                            Grade Assessment: II - A patient with mild systemic                            disease. After reviewing the risks and benefits,                            the patient was deemed in satisfactory condition to                            undergo the procedure.  After obtaining informed consent, the colonoscope                            was passed under direct vision. Throughout the                            procedure, the patient's blood pressure, pulse, and                            oxygen saturations were monitored continuously. The                            CF [id number] #[id number] was introduced through the anus                            and advanced to the the terminal ileum,  with                            identification of the appendiceal orifice and IC                            valve. The colonoscopy was performed without                            difficulty. The patient tolerated the procedure                            well. The quality of the bowel preparation was                            good. The terminal ileum, ileocecal valve,                            appendiceal orifice, and rectum were photographed. Scope In: 2:27:43 PM Scope Out: 2:41:40 PM Scope Withdrawal Time: 0 hours 11 minutes 52 seconds  Total Procedure Duration: 0 hours 13 minutes 57 seconds  Findings:                 The perianal and digital rectal examinations were                            normal.                           The terminal ileum appeared normal.                           A single (solitary) ulcer was found at the                            ileocecal valve. No bleeding was present. Biopsies                            were taken with a cold forceps for histology.  Multiple small-mouthed diverticula were found in                            the sigmoid colon.                           Internal hemorrhoids were found during retroflexion.                           The exam was otherwise without abnormality. Complications:            No immediate complications. Estimated blood loss:                            Minimal. Estimated Blood Loss:     Estimated blood loss was minimal. Impression:               - The examined portion of the ileum was normal.                           - A single (solitary) ulcer at the ileocecal valve.                            Biopsied.                           - Diverticulosis in the sigmoid colon.                           - Internal hemorrhoids.                           - The examination was otherwise normal. Recommendation:           - Patient has a contact number available for                             emergencies. The signs and symptoms of potential                            delayed complications were discussed with the                            patient. Return to normal activities tomorrow.                            Written discharge instructions were provided to the                            patient.                           - Resume previous diet.                           - Continue present medications.                           -  Await pathology results.                           - Avoid NSAIDs if taking routinely, in regards to                            IC valve ulcer.                           - Repeat colonoscopy in 1-3 years per surveillance                            guidelines for MSH6 gene mutation Elspeth P. Leigh, MD 05/14/2024 2:46:59 PM This report has been signed electronically.

## 2024-05-14 NOTE — Progress Notes (Signed)
 Sedate, gd SR, tolerated procedure well, VSS, report to RN

## 2024-05-14 NOTE — Progress Notes (Signed)
 Pt states that he does take Diclofenac twice daily for shoulder pain. Dr. Leigh made aware. Advised patient to discontinue for a month and then take as needed, may take tylenol . Pt instructed and verbalizes understanding.

## 2024-05-14 NOTE — Progress Notes (Signed)
 Pt's states no medical or surgical changes since previsit or office visit.

## 2024-05-15 ENCOUNTER — Telehealth: Payer: Self-pay

## 2024-05-15 NOTE — Telephone Encounter (Signed)
 Called and spoke to patient.  He has been scheduled for 1st hem banding on 9-23

## 2024-05-15 NOTE — Telephone Encounter (Signed)
-----   Message from Elspeth SHAUNNA Naval sent at 05/14/2024  4:56 PM EDT ----- Regarding: hem banding Jan can you help book this patient for next routine hemorrhoid banding? thanks

## 2024-05-15 NOTE — Telephone Encounter (Signed)
 Follow up call to pt, no answer.

## 2024-05-19 ENCOUNTER — Ambulatory Visit: Payer: Self-pay | Admitting: Gastroenterology

## 2024-05-19 LAB — SURGICAL PATHOLOGY

## 2024-07-15 ENCOUNTER — Encounter: Admitting: Gastroenterology

## 2024-08-13 ENCOUNTER — Encounter: Admitting: Gastroenterology

## 2024-09-15 ENCOUNTER — Ambulatory Visit: Admitting: Gastroenterology

## 2024-09-15 ENCOUNTER — Encounter: Payer: Self-pay | Admitting: Gastroenterology

## 2024-09-15 VITALS — BP 110/70 | HR 60 | Ht 72.0 in | Wt 170.0 lb

## 2024-09-15 DIAGNOSIS — K641 Second degree hemorrhoids: Secondary | ICD-10-CM | POA: Diagnosis not present

## 2024-09-15 NOTE — Progress Notes (Signed)
 53 year old male here for a follow-up visit for hemorrhoid banding today.  He recently had a colonoscopy with me in July, had internal hemorrhoids noted and endorse some symptoms, wanted treatment.  He has had symptoms ongoing for some time of swelling, grade 2 prolapse, and itching.  Symptoms worse with constipation, now on MiraLAX daily which has helped reduce the symptoms.  We discussed options for definitive management to include hemorrhoid banding, surgery etc.  I think he is a good candidate for banding to control his symptoms, hopefully avoid surgery.  We discussed risks of hemorrhoid banding to include pain, bleeding etc. he understands this and wishes to proceed.    Of note the patient has a latex allergy, will use LATEX FREE BANDS     Colonoscopy 04/05/22: - The perianal and digital rectal examinations were normal. - A 4 mm polyp was found in the sigmoid colon. The polyp was sessile. The polyp was removed with a cold snare. Resection and retrieval were complete. - Three sessile polyps were found in the recto-sigmoid colon. The polyps were 2 to 3 mm in size. These polyps were removed with a cold snare. Resection and retrieval were complete. - A diminutive polyp was found in the distal rectum. The polyp was sessile. The polyp was removed with a cold snare. Resection and retrieval were complete. - Internal hemorrhoids were found during retroflexion. - The exam was otherwise without abnormality.   Surgical [P], colon, rectum, sigmoid, and recto-sigmoid, polyp (5) TUBULAR ADENOMA HYPERPLASTIC POLYPS NEGATIVE FOR HIGH-GRADE DYSPLASIA AND CARCINOMA       EGD 11/07/2015: ENDOSCOPIC IMPRESSION: Normal esophagus Benign appearing gastric polyps as outlined above, representative sample taken Mild gastric erythema, biopsies obtained Normal duodenum     1. Surgical [P], gastric polyp - BENIGN FUNDIC GLAND POLYP(S). 2. Surgical [P], gastric antrum and gastric body BENIGN GASTRIC MUCOSA. NO  HELICOBACTER PYLORI, INTESTINAL METAPLASIA OR ACTIVE INFLAMMATION IDENTIFIED.    EGD 05/14/24: - Esophagogastric landmarks were identified: the Z-line was found at 42 cm, the gastroesophageal junction was found at 42 cm and the upper extent of the gastric folds was found at 42 cm from the incisors. Findings: - The exam of the esophagus was otherwise normal. - Prominent gastric folds vs. benign appearing hyperplastic changes, were found in the gastric antrum and in the prepyloric region of the stomach. Biopsies were taken with a cold forceps for histology. - Multiple small sessile polyps were found in the gastric body. A few of these polyps were removed as a representative sample with a cold biopsy forceps. Resection and retrieval were complete. - The exam of the stomach was otherwise normal. - Biopsies were taken with a cold forceps for Helicobacter pylori testing. - The examined duodenum was normal. The ampulla was visualized and normal.   Colonoscopy 05/14/24: - The perianal and digital rectal examinations were normal. - The terminal ileum appeared normal. - A single (solitary) ulcer was found at the ileocecal valve. No bleeding was present. Biopsies were taken with a cold forceps for histology. - Multiple small-mouthed diverticula were found in the sigmoid colon. - Internal hemorrhoids were found during retroflexion. - The exam was otherwise without abnormality.  FINAL DIAGNOSIS       1. 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      -AN IMMUNOHISTOCHEMICAL STAIN FOR HELICOBACTER PYLORI ORGANISMS IS PENDING AND      WILL BE REPORTED IN AN ADDENDUM       2. Surgical [P], gastric polyps :      -  FUNDIC GLAND POLYPS WITH REACTIVE/REPARATIVE CHANGE.       3. Surgical [P], gastric  antrum and gastric body :      -  ANTRAL AND OXYNTIC MUCOSA WITH NO SIGNIFICANT PATHOLOGY.      -  NO HELICOBACTER PYLORI ORGANISMS IDENTIFIED ON H&E STAINED SLIDE.       4. Surgical [P], ileocecal valve ulcer :      -  PREDOMINANTLY COLONIC TYPE MUCOSA WITH FOCAL EROSION AND FIBROPURULENT DEBRIS      CONSISTENT WITH ULCERATION, NONSPECIFIC      -  NEGATIVE FOR DEFINITIVE EVIDENCE OF CHRONICITY, GRANULOMAS, VIRAL CYTOPATHIC      EFFECT AND DYSPLASIA.      NOTE: ALTHOUGH THERE IS MINIMAL ARCHITECTURAL DISARRAY THERE IS NO DEFINITIVE      EVIDENCE OF CHRONICITY IN THE MORPHOLOGIC DIFFERENTIAL DIAGNOSIS IS BETWEEN      NSAIDS AND POSSIBLE IBD, CLINICAL CORRELATION NECESSARY.  PROCEDURE NOTE: The patient presents with symptomatic grade II  hemorrhoids, requesting rubber band ligation of his/her hemorrhoidal disease.  All risks, benefits and alternative forms of therapy were described and informed consent was obtained.   The anorectum was pre-medicated with recticare The decision was made to band the LL internal hemorrhoid, and the [facility] System was used to perform band ligation without complication. WE USED A LATEX FREE BAND. Digital anorectal examination was then performed to assure proper positioning of the band, and to adjust the banded tissue as required.  The patient was discharged home without pain or other issues.  Dietary and behavioral recommendations were given and along with follow-up instructions.     The following adjunctive treatments were recommended: Continue miralax daily  The patient will return in 2-4 weeks for  follow-up and possible additional banding as required. No complications were encountered and the patient tolerated the procedure well.  Marcey Naval, MD [facility]

## 2024-09-15 NOTE — Patient Instructions (Signed)
 HEMORRHOID BANDING PROCEDURE    FOLLOW-UP CARE   The procedure you have had should have been relatively painless since the banding of the area involved does not have nerve endings and there is no pain sensation.  The rubber band cuts off the blood supply to the hemorrhoid and the band may fall off as soon as 48 hours after the banding (the band may occasionally be seen in the toilet bowl following a bowel movement). You may notice a temporary feeling of fullness in the rectum which should respond adequately to plain Tylenol  or Motrin .  Following the banding, avoid strenuous exercise that evening and resume full activity the next day.  A sitz bath (soaking in a warm tub) or bidet is soothing, and can be useful for cleansing the area after bowel movements.     To avoid constipation, take two tablespoons of natural wheat bran, natural oat bran, flax, Benefiber or any over the counter fiber supplement and increase your water intake to 7-8 glasses daily.    Unless you have been prescribed anorectal medication, do not put anything inside your rectum for two weeks: No suppositories, enemas, fingers, etc.  Occasionally, you may have more bleeding than usual after the banding procedure.  This is often from the untreated hemorrhoids rather than the treated one.  Don't be concerned if there is a tablespoon or so of blood.  If there is more blood than this, lie flat with your bottom higher than your head and apply an ice pack to the area. If the bleeding does not stop within a half an hour or if you feel faint, call our office at [phone] or go to the emergency room.  Problems are not common; however, if there is a substantial amount of bleeding, severe pain, chills, fever or difficulty passing urine (very rare) or other problems, you should call us  at [phone] or report to the nearest emergency room.  Do not stay seated continuously for more than 2-3 hours for a day or two after the procedure.   Tighten your buttock muscles 10-15 times every two hours and take 10-15 deep breaths every 1-2 hours.  Do not spend more than a few minutes on the toilet if you cannot empty your bowel; instead re-visit the toilet at a later time.   You have been scheduled for a 2nd hemorrhoid banding appointment on Monday, 10-27-24 at 2:30pm. Please arrive 10 minutes early for registration. If you need to reschedule or cancel this appointment please call [phone] as soon as possible. Thank you.  Thank you for entrusting me with your care and for choosing [facility], Dr. Elspeth Naval    _______________________________________________________  If your blood pressure at your visit was 140/90 or greater, please contact your primary care physician to follow up on this.  _______________________________________________________  If you are age 54 or older, your body mass index should be between 23-30. Your Body mass index is 23.06 kg/m. If this is out of the aforementioned range listed, please consider follow up with your Primary Care Provider.  If you are age 40 or younger, your body mass index should be between 19-25. Your Body mass index is 23.06 kg/m. If this is out of the aformentioned range listed, please consider follow up with your Primary Care Provider.   ________________________________________________________  The [place] [facility] providers would like to encourage you to use MYCHART to communicate with providers for non-urgent requests or questions.  Due to long hold times on the telephone,  sending your provider a message by [facility] may be a faster and more efficient way to get a response.  Please allow 48 business hours for a response.  Please remember that this is for non-urgent requests.  _______________________________________________________  [facility] is using a team-based approach to care.  Your team is made up of your doctor and two to three APPS. Our APPS (Nurse  Practitioners and Physician Assistants) work with your physician to ensure care continuity for you. They are fully qualified to address your health concerns and develop a treatment plan. They communicate directly with your gastroenterologist to care for you. Seeing the Advanced Practice Practitioners on your physician's team can help you by facilitating care more promptly, often allowing for earlier appointments, access to diagnostic testing, procedures, and other specialty referrals.

## 2024-10-27 ENCOUNTER — Encounter: Admitting: Gastroenterology

## 2024-11-04 ENCOUNTER — Encounter: Payer: Self-pay | Admitting: Gastroenterology

## 2024-11-04 ENCOUNTER — Ambulatory Visit: Admitting: Gastroenterology

## 2024-11-04 VITALS — BP 108/74 | HR 66 | Ht 72.0 in | Wt 171.5 lb

## 2024-11-04 DIAGNOSIS — K641 Second degree hemorrhoids: Secondary | ICD-10-CM | POA: Diagnosis not present

## 2024-11-04 NOTE — Patient Instructions (Addendum)
 HEMORRHOID BANDING PROCEDURE    FOLLOW-UP CARE   The procedure you have had should have been relatively painless since the banding of the area involved does not have nerve endings and there is no pain sensation.  The rubber band cuts off the blood supply to the hemorrhoid and the band may fall off as soon as 48 hours after the banding (the band may occasionally be seen in the toilet bowl following a bowel movement). You may notice a temporary feeling of fullness in the rectum which should respond adequately to plain Tylenol  or Motrin .  Following the banding, avoid strenuous exercise that evening and resume full activity the next day.  A sitz bath (soaking in a warm tub) or bidet is soothing, and can be useful for cleansing the area after bowel movements.     To avoid constipation, take two tablespoons of natural wheat bran, natural oat bran, flax, Benefiber or any over the counter fiber supplement and increase your water intake to 7-8 glasses daily.    Unless you have been prescribed anorectal medication, do not put anything inside your rectum for two weeks: No suppositories, enemas, fingers, etc.  Occasionally, you may have more bleeding than usual after the banding procedure.  This is often from the untreated hemorrhoids rather than the treated one.  Dont be concerned if there is a tablespoon or so of blood.  If there is more blood than this, lie flat with your bottom higher than your head and apply an ice pack to the area. If the bleeding does not stop within a half an hour or if you feel faint, call our office at [phone] or go to the emergency room.  Problems are not common; however, if there is a substantial amount of bleeding, severe pain, chills, fever or difficulty passing urine (very rare) or other problems, you should call us  at [phone] or report to the nearest emergency room.  Do not stay seated continuously for more than 2-3 hours for a day or two after the procedure.   Tighten your buttock muscles 10-15 times every two hours and take 10-15 deep breaths every 1-2 hours.  Do not spend more than a few minutes on the toilet if you cannot empty your bowel; instead re-visit the toilet at a later time.  You have been scheduled for a 3rd banding appointment on Thursday, March 5th at 11:00 am. Please arrive 10 minutes early for registration. If you need to reschedule or cancel this appointment please call [phone] as soon as possible. Thank you.   Thank you for entrusting me with your care and for choosing [facility], Dr. Elspeth Naval    ._______________________________________________________  If your blood pressure at your visit was 140/90 or greater, please contact your primary care physician to follow up on this.  _______________________________________________________  If you are age 31 or older, your body mass index should be between 23-30. Your Body mass index is 23.26 kg/m. If this is out of the aforementioned range listed, please consider follow up with your Primary Care Provider.  If you are age 66 or younger, your body mass index should be between 19-25. Your Body mass index is 23.26 kg/m. If this is out of the aformentioned range listed, please consider follow up with your Primary Care Provider.   ________________________________________________________  The [place] providers would like to encourage you to use MYCHART to communicate with providers for non-urgent requests or questions.  Due to long hold times on the  telephone, sending your provider a message by [facility] may be a faster and more efficient way to get a response.  Please allow 48 business hours for a response.  Please remember that this is for non-urgent requests.  _______________________________________________________  [facility] is using a team-based approach to care.  Your team is made up of your doctor and two to three APPS. Our APPS (Nurse  Practitioners and Physician Assistants) work with your physician to ensure care continuity for you. They are fully qualified to address your health concerns and develop a treatment plan. They communicate directly with your gastroenterologist to care for you. Seeing the Advanced Practice Practitioners on your physician's team can help you by facilitating care more promptly, often allowing for earlier appointments, access to diagnostic testing, procedures, and other specialty referrals.

## 2024-11-04 NOTE — Progress Notes (Signed)
 54 year old male here for a follow-up visit for hemorrhoid banding today.  Recall his last He colonoscopy with me in July 2025, had internal hemorrhoids noted and endorse some symptoms, wanted treatment.  He has had symptoms ongoing for some time of swelling, grade 2 prolapse, and itching.  Symptoms worse with constipation, now on MiraLAX daily which has helped reduce the symptoms.  We discussed options for definitive management to include hemorrhoid banding, surgery etc.  He had LL hemorrhoid banded on 09/15/24. He tolerated it well without issues. Reports significant improvement in his hemorrhoid symptoms since that time. He wishes to continue with 2nd banding today. Again discussed risks / benefits / alternatives, he wishes to proceed.   Of note the patient has a latex allergy, will use LATEX FREE BANDS   Colonoscopy 04/05/22: - The perianal and digital rectal examinations were normal. - A 4 mm polyp was found in the sigmoid colon. The polyp was sessile. The polyp was removed with a cold snare. Resection and retrieval were complete. - Three sessile polyps were found in the recto-sigmoid colon. The polyps were 2 to 3 mm in size. These polyps were removed with a cold snare. Resection and retrieval were complete. - A diminutive polyp was found in the distal rectum. The polyp was sessile. The polyp was removed with a cold snare. Resection and retrieval were complete. - Internal hemorrhoids were found during retroflexion. - The exam was otherwise without abnormality.   Surgical [P], colon, rectum, sigmoid, and recto-sigmoid, polyp (5) TUBULAR ADENOMA HYPERPLASTIC POLYPS NEGATIVE FOR HIGH-GRADE DYSPLASIA AND CARCINOMA       EGD 11/07/2015: ENDOSCOPIC IMPRESSION: Normal esophagus Benign appearing gastric polyps as outlined above, representative sample taken Mild gastric erythema, biopsies obtained Normal duodenum     1. Surgical [P], gastric polyp - BENIGN FUNDIC GLAND POLYP(S). 2. Surgical [P],  gastric antrum and gastric body BENIGN GASTRIC MUCOSA. NO HELICOBACTER PYLORI, INTESTINAL METAPLASIA OR ACTIVE INFLAMMATION IDENTIFIED.    EGD 05/14/24: - Esophagogastric landmarks were identified: the Z-line was found at 42 cm, the gastroesophageal junction was found at 42 cm and the upper extent of the gastric folds was found at 42 cm from the incisors. Findings: - The exam of the esophagus was otherwise normal. - Prominent gastric folds vs. benign appearing hyperplastic changes, were found in the gastric antrum and in the prepyloric region of the stomach. Biopsies were taken with a cold forceps for histology. - Multiple small sessile polyps were found in the gastric body. A few of these polyps were removed as a representative sample with a cold biopsy forceps. Resection and retrieval were complete. - The exam of the stomach was otherwise normal. - Biopsies were taken with a cold forceps for Helicobacter pylori testing. - The examined duodenum was normal. The ampulla was visualized and normal.     Colonoscopy 05/14/24: - The perianal and digital rectal examinations were normal. - The terminal ileum appeared normal. - A single (solitary) ulcer was found at the ileocecal valve. No bleeding was present. Biopsies were taken with a cold forceps for histology. - Multiple small-mouthed diverticula were found in the sigmoid colon. - Internal hemorrhoids were found during retroflexion. - The exam was otherwise without abnormality.   FINAL DIAGNOSIS       1. 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      -AN IMMUNOHISTOCHEMICAL STAIN FOR HELICOBACTER PYLORI ORGANISMS IS PENDING AND      WILL BE REPORTED IN AN ADDENDUM       2. Surgical [P], gastric polyps :      -  FUNDIC GLAND POLYPS WITH  REACTIVE/REPARATIVE CHANGE.       3. Surgical [P], gastric antrum and gastric body :      -  ANTRAL AND OXYNTIC MUCOSA WITH NO SIGNIFICANT PATHOLOGY.      -  NO HELICOBACTER PYLORI ORGANISMS IDENTIFIED ON H&E STAINED SLIDE.       4. Surgical [P], ileocecal valve ulcer :      -  PREDOMINANTLY COLONIC TYPE MUCOSA WITH FOCAL EROSION AND FIBROPURULENT DEBRIS      CONSISTENT WITH ULCERATION, NONSPECIFIC      -  NEGATIVE FOR DEFINITIVE EVIDENCE OF CHRONICITY, GRANULOMAS, VIRAL CYTOPATHIC      EFFECT AND DYSPLASIA.      NOTE: ALTHOUGH THERE IS MINIMAL ARCHITECTURAL DISARRAY THERE IS NO DEFINITIVE      EVIDENCE OF CHRONICITY IN THE MORPHOLOGIC DIFFERENTIAL DIAGNOSIS IS BETWEEN      NSAIDS AND POSSIBLE IBD, CLINICAL CORRELATION NECESSARY.   PROCEDURE NOTE: The patient presents with symptomatic grade II  hemorrhoids, requesting rubber band ligation of his/her hemorrhoidal disease.  All risks, benefits and alternative forms of therapy were described and informed consent was obtained.     The anorectum was pre-medicated with recticare The decision was made to band the RP internal hemorrhoid, and the [facility] System was used to perform band ligation without complication. WE USED A LATEX FREE BAND. Digital anorectal examination was then performed to assure proper positioning of the band, and to adjust the banded tissue as required.  The patient was discharged home without pain or other issues.  Dietary and behavioral recommendations were given and along with follow-up instructions.     The following adjunctive treatments were recommended: Continue miralax daily   The patient will return in 4 weeks for  follow-up and possible additional banding as required. No complications were encountered and the patient tolerated the procedure well.   Marcey Naval, MD [facility]

## 2024-12-25 ENCOUNTER — Encounter: Admitting: Gastroenterology
